# Patient Record
Sex: Female | Born: 1995 | Race: Black or African American | Hispanic: No | Marital: Single
Health system: Midwestern US, Community
[De-identification: ages and names within clinical notes are randomized; demographics above are authoritative.]

## PROBLEM LIST (undated history)

## (undated) DIAGNOSIS — F209 Schizophrenia, unspecified: Secondary | ICD-10-CM

## (undated) DIAGNOSIS — F319 Bipolar disorder, unspecified: Secondary | ICD-10-CM

## (undated) HISTORY — PX: APPENDECTOMY: SHX54

---

## 2018-03-15 ENCOUNTER — Encounter: Payer: Self-pay | Admitting: Emergency Medicine

## 2018-03-15 ENCOUNTER — Emergency Department
Admission: EM | Admit: 2018-03-15 | Discharge: 2018-03-15 | Disposition: A | Discharge status: Home / Self Care | Payer: Medicaid Other | Attending: Emergency Medicine | Admitting: Emergency Medicine

## 2018-03-15 DIAGNOSIS — F17210 Nicotine dependence, cigarettes, uncomplicated: Secondary | ICD-10-CM | POA: Insufficient documentation

## 2018-03-15 DIAGNOSIS — F319 Bipolar disorder, unspecified: Secondary | ICD-10-CM

## 2018-03-15 DIAGNOSIS — R300 Dysuria: Secondary | ICD-10-CM | POA: Diagnosis not present

## 2018-03-15 DIAGNOSIS — F4323 Adjustment disorder with mixed anxiety and depressed mood: Secondary | ICD-10-CM | POA: Diagnosis not present

## 2018-03-15 DIAGNOSIS — R45851 Suicidal ideations: Secondary | ICD-10-CM | POA: Insufficient documentation

## 2018-03-15 DIAGNOSIS — F329 Major depressive disorder, single episode, unspecified: Secondary | ICD-10-CM | POA: Insufficient documentation

## 2018-03-15 DIAGNOSIS — F32A Depression, unspecified: Secondary | ICD-10-CM

## 2018-03-15 HISTORY — DX: Bipolar disorder, unspecified: F31.9

## 2018-03-15 HISTORY — DX: Schizophrenia, unspecified: F20.9

## 2018-03-15 LAB — URINALYSIS, ROUTINE W REFLEX MICROSCOPIC
BACTERIA UA: NONE SEEN
Bilirubin Urine: NEGATIVE
Glucose, UA: NEGATIVE mg/dL
Ketones, ur: NEGATIVE mg/dL
NITRITE: NEGATIVE
PROTEIN: 100 mg/dL — AB
SQUAMOUS EPITHELIAL / LPF: NONE SEEN (ref 0–5)
Specific Gravity, Urine: 1.021 (ref 1.005–1.030)
pH: 7 (ref 5.0–8.0)

## 2018-03-15 LAB — COMPREHENSIVE METABOLIC PANEL
ALK PHOS: 65 U/L (ref 38–126)
ALT: 10 U/L (ref 0–44)
ANION GAP: 7 (ref 5–15)
AST: 17 U/L (ref 15–41)
Albumin: 4 g/dL (ref 3.5–5.0)
BILIRUBIN TOTAL: 0.5 mg/dL (ref 0.3–1.2)
BUN: 11 mg/dL (ref 6–20)
CALCIUM: 8.7 mg/dL — AB (ref 8.9–10.3)
CO2: 26 mmol/L (ref 22–32)
Chloride: 107 mmol/L (ref 98–111)
Creatinine, Ser: 0.73 mg/dL (ref 0.44–1.00)
GFR calc Af Amer: >60 mL/min (ref >60)
GLUCOSE: 110 mg/dL — AB (ref 70–99)
Potassium: 3.4 mmol/L — ABNORMAL LOW (ref 3.5–5.1)
Sodium: 140 mmol/L (ref 135–145)
TOTAL PROTEIN: 7.6 g/dL (ref 6.5–8.1)

## 2018-03-15 LAB — ACETAMINOPHEN LEVEL: ACETAMINOPHEN (TYLENOL), SERUM: 12 ug/mL (ref 10–30)

## 2018-03-15 LAB — CBC
HEMATOCRIT: 34.1 % — AB (ref 35.0–47.0)
Hemoglobin: 11.1 g/dL — ABNORMAL LOW (ref 12.0–16.0)
MCH: 23.7 pg — ABNORMAL LOW (ref 26.0–34.0)
MCHC: 32.5 g/dL (ref 32.0–36.0)
MCV: 72.8 fL — ABNORMAL LOW (ref 80.0–100.0)
Platelets: 325 10*3/uL (ref 150–440)
RBC: 4.68 MIL/uL (ref 3.80–5.20)
RDW: 15.8 % — ABNORMAL HIGH (ref 11.5–14.5)
WBC: 8.3 10*3/uL (ref 3.6–11.0)

## 2018-03-15 LAB — SALICYLATE LEVEL

## 2018-03-15 LAB — URINE DRUG SCREEN, QUALITATIVE (ARMC ONLY)
Amphetamines, Ur Screen: NOT DETECTED
Benzodiazepine, Ur Scrn: NOT DETECTED
Cannabinoid 50 Ng, Ur ~~LOC~~: NOT DETECTED
Cocaine Metabolite,Ur ~~LOC~~: NOT DETECTED
MDMA (Ecstasy)Ur Screen: NOT DETECTED
METHADONE SCREEN, URINE: NOT DETECTED
OPIATE, UR SCREEN: NOT DETECTED
PHENCYCLIDINE (PCP) UR S: NOT DETECTED
Tricyclic, Ur Screen: NOT DETECTED

## 2018-03-15 LAB — ETHANOL: Alcohol, Ethyl (B): <10 mg/dL (ref <10)

## 2018-03-15 LAB — POCT PREGNANCY, URINE: Preg Test, Ur: NEGATIVE

## 2018-03-15 MED ORDER — FOSFOMYCIN TROMETHAMINE 3 G PO PACK
3.0000 g | PACK | Freq: Once | ORAL | Status: AC
  Administered 2018-03-15: 3 g via ORAL
  Filled 2018-03-15: qty 3

## 2018-03-15 MED ORDER — IBUPROFEN 600 MG PO TABS
600.0000 mg | ORAL_TABLET | Freq: Once | ORAL | Status: AC
  Administered 2018-03-15: 600 mg via ORAL
  Filled 2018-03-15: qty 1

## 2018-03-15 NOTE — ED Notes (Signed)
Nurse talked with Patient's mom, and gave her pass-code per Patient's request, but she wanted to rest and talk to mom later today.

## 2018-03-15 NOTE — ED Notes (Signed)
Patient transferred to room 22, received report, Patient is calm and cooperative, will continue to monitor, dressed in burgundy scrubs.

## 2018-03-15 NOTE — ED Notes (Addendum)
Patient's results back with UTI, and Doctor ordered fosfomycin.
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
## 2018-03-15 NOTE — ED Notes (Signed)
Patient complaining of stomach cramps due to menstrual cycle, nurse received order for motrin from Dr. Scotty CourtStafford.

## 2018-03-15 NOTE — ED Notes (Signed)
Patient is resting with eyes closed, arouses easily, nurse will continue to monitor.
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
## 2018-03-15 NOTE — ED Notes (Signed)
Pt changed into scrubs and belongings secured. One bebe purse (gold), iphone charger, iphone, black leggings, calvin klein sports bra, yellow tshirt, blue and white windbreaker, black vans (shoes). One pair earrings put in sterile cup and put in bag.

## 2018-03-15 NOTE — ED Notes (Signed)
Patient changed her mind,patient in showe at this time.

## 2018-03-15 NOTE — ED Notes (Signed)
Patient voiced understanding of discharge instructions, no signs of distress, Patient with UTI and too fosfomycin prior to discharge, all belongings given back to Patient.

## 2018-03-15 NOTE — ED Provider Notes (Signed)
-----------------------------------------   3:43 PM on 03/15/2018 -----------------------------------------  Patient has been seen by psychiatry, they believe the patient is safe for discharge home from a psychiatric standpoint.  Patient's medical work-up is been largely nonrevealing.  Patient is describing mild dysuria, we will wait for urinalysis to result prior to discharge.   Minna AntisPaduchowski, Kyrah Schiro, MD 03/15/18 1544

## 2018-03-15 NOTE — Discharge Instructions (Addendum)
You have been seen in the emergency department for a  psychiatric concern. You have been evaluated both medically as well as psychiatrically. Please follow-up with your outpatient resources provided. Return to the emergency department for any worsening symptoms, or any thoughts of hurting yourself or anyone else so that we may attempt to help you. 

## 2018-03-15 NOTE — ED Notes (Signed)
VOL/Consult pending  
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
## 2018-03-15 NOTE — ED Notes (Signed)
Patient refused shower due she had shower before coming.

## 2018-04-01 ENCOUNTER — Encounter: Payer: Self-pay | Admitting: Emergency Medicine

## 2018-04-01 DIAGNOSIS — R531 Weakness: Secondary | ICD-10-CM | POA: Insufficient documentation

## 2018-04-01 DIAGNOSIS — N3001 Acute cystitis with hematuria: Secondary | ICD-10-CM | POA: Diagnosis not present

## 2018-04-01 DIAGNOSIS — F17200 Nicotine dependence, unspecified, uncomplicated: Secondary | ICD-10-CM | POA: Diagnosis not present

## 2018-04-01 DIAGNOSIS — F209 Schizophrenia, unspecified: Secondary | ICD-10-CM | POA: Diagnosis not present

## 2018-04-01 DIAGNOSIS — F319 Bipolar disorder, unspecified: Secondary | ICD-10-CM | POA: Diagnosis not present

## 2018-04-01 DIAGNOSIS — R109 Unspecified abdominal pain: Secondary | ICD-10-CM | POA: Diagnosis present

## 2018-04-01 LAB — BASIC METABOLIC PANEL
ANION GAP: 6 (ref 5–15)
BUN: 12 mg/dL (ref 6–20)
CALCIUM: 8.8 mg/dL — AB (ref 8.9–10.3)
CO2: 29 mmol/L (ref 22–32)
Chloride: 106 mmol/L (ref 98–111)
Creatinine, Ser: 0.63 mg/dL (ref 0.44–1.00)
Glucose, Bld: 93 mg/dL (ref 70–99)
Potassium: 3.8 mmol/L (ref 3.5–5.1)
SODIUM: 141 mmol/L (ref 135–145)

## 2018-04-01 LAB — URINALYSIS, COMPLETE (UACMP) WITH MICROSCOPIC
BILIRUBIN URINE: NEGATIVE
Glucose, UA: NEGATIVE mg/dL
Hgb urine dipstick: NEGATIVE
KETONES UR: NEGATIVE mg/dL
NITRITE: NEGATIVE
PH: 7 (ref 5.0–8.0)
Protein, ur: NEGATIVE mg/dL
Specific Gravity, Urine: 1.019 (ref 1.005–1.030)

## 2018-04-01 LAB — CBC
HCT: 34.9 % — ABNORMAL LOW (ref 35.0–47.0)
HEMOGLOBIN: 11.5 g/dL — AB (ref 12.0–16.0)
MCH: 23.7 pg — ABNORMAL LOW (ref 26.0–34.0)
MCHC: 32.9 g/dL (ref 32.0–36.0)
MCV: 72.2 fL — ABNORMAL LOW (ref 80.0–100.0)
Platelets: 307 10*3/uL (ref 150–440)
RBC: 4.83 MIL/uL (ref 3.80–5.20)
RDW: 15.7 % — ABNORMAL HIGH (ref 11.5–14.5)
WBC: 5.7 10*3/uL (ref 3.6–11.0)

## 2018-04-01 LAB — POCT PREGNANCY, URINE: Preg Test, Ur: NEGATIVE

## 2018-04-01 NOTE — ED Triage Notes (Signed)
Patient brought in by ems. Patient with complaint of feeling weak, generalized abdominal pain and nausea that started this morning.

## 2018-04-01 NOTE — ED Notes (Signed)
Patient to waiting room via wheelchair by EMS for generalized abdominal pain.  Per EMS patient had no nausea.  VS:  HR - 106; BP 138/91; CBG - 110.

## 2018-04-02 ENCOUNTER — Emergency Department: Payer: Medicaid Other

## 2018-04-02 ENCOUNTER — Encounter: Payer: Self-pay | Admitting: Radiology

## 2018-04-02 ENCOUNTER — Emergency Department
Admission: EM | Admit: 2018-04-02 | Discharge: 2018-04-02 | Disposition: A | Discharge status: Home / Self Care | Payer: Medicaid Other | Attending: Emergency Medicine | Admitting: Emergency Medicine

## 2018-04-02 DIAGNOSIS — R1084 Generalized abdominal pain: Secondary | ICD-10-CM

## 2018-04-02 DIAGNOSIS — N3001 Acute cystitis with hematuria: Secondary | ICD-10-CM

## 2018-04-02 DIAGNOSIS — R531 Weakness: Secondary | ICD-10-CM

## 2018-04-02 MED ORDER — METOCLOPRAMIDE HCL 5 MG/ML IJ SOLN
10.0000 mg | Freq: Once | INTRAMUSCULAR | Status: AC
  Administered 2018-04-02: 10 mg via INTRAVENOUS
  Filled 2018-04-02: qty 2

## 2018-04-02 MED ORDER — METOCLOPRAMIDE HCL 10 MG PO TABS: 10.0000 mg | ORAL_TABLET | Freq: Three times a day (TID) | ORAL | 0 refills | Status: DC | PRN

## 2018-04-02 MED ORDER — IOPAMIDOL (ISOVUE-300) INJECTION 61%
100.0000 mL | Freq: Once | INTRAVENOUS | Status: AC | PRN
  Administered 2018-04-02: 100 mL via INTRAVENOUS

## 2018-04-02 MED ORDER — CEPHALEXIN 500 MG PO CAPS
500.0000 mg | ORAL_CAPSULE | Freq: Once | ORAL | Status: AC
  Administered 2018-04-02: 500 mg via ORAL
  Filled 2018-04-02: qty 1

## 2018-04-02 MED ORDER — SODIUM CHLORIDE 0.9 % IV BOLUS
1000.0000 mL | Freq: Once | INTRAVENOUS | Status: AC
  Administered 2018-04-02: 1000 mL via INTRAVENOUS

## 2018-04-02 MED ORDER — CEPHALEXIN 500 MG PO CAPS: 500.0000 mg | ORAL_CAPSULE | Freq: Four times a day (QID) | ORAL | 0 refills | Status: AC

## 2018-04-02 NOTE — ED Notes (Signed)
Pt returned from CT °

## 2018-04-02 NOTE — ED Provider Notes (Signed)
[facility] Emergency Department Provider Note   ____________________________________________   First MD Initiated Contact with Patient 04/02/18 0209     (approximate)  I have reviewed the triage vital signs and the nursing notes.   HISTORY  Chief Complaint Weakness and Abdominal Pain    HPI Debra Mahoney is a 22 y.o. female who comes into the hospital today with some abdominal pain and feeling weak all over.  The patient also had a headache and states that her symptoms all started this morning.  She states that she could not eat anything all day and has had a bottle of water.  The patient denies any cough, runny nose, fevers or chills.  She is been nauseous with no vomiting.  She denies any pain with urination and also has had no sick contacts.  She states that the pain is all over her abdomen and she just did not feel good.  The patient decided to come into the hospital today for further evaluation of her symptoms.   Past Medical History:  Diagnosis Date  . Bipolar 1 disorder (HCC)   . Schizophrenia [facility])     Patient Active Problem List   Diagnosis Date Noted  . Bipolar 1 disorder (HCC) 03/15/2018  . Adjustment disorder with mixed anxiety and depressed mood 03/15/2018    Past Surgical History:  Procedure Laterality Date  . APPENDECTOMY      Prior to Admission medications   Medication Sig Start Date End Date Taking? Authorizing Provider  cephALEXin (KEFLEX) 500 MG capsule Take 1 capsule (500 mg total) by mouth 4 (four) times daily for 10 days. 04/02/18 04/12/18  Rebecka Apley, MD  metoCLOPramide (REGLAN) 10 MG tablet Take 1 tablet (10 mg total) by mouth every 8 (eight) hours as needed. 04/02/18   Rebecka Apley, MD    Allergies Shellfish allergy  No family history on file.  Social History Social History   Tobacco Use  . Smoking status: Current Every Day Smoker  . Smokeless tobacco: Never Used  Substance Use Topics  . Alcohol  use: Not Currently    Frequency: Never  . Drug use: Not Currently    Review of Systems  Constitutional: Unwell feeling Eyes: No visual changes. ENT: No sore throat. Cardiovascular: Denies chest pain. Respiratory: Denies shortness of breath. Gastrointestinal:  abdominal pain, nausea, no vomiting.  No diarrhea.  No constipation. Genitourinary: Negative for dysuria. Musculoskeletal: Negative for back pain. Skin: Negative for rash. Neurological: Headache, weakness   ____________________________________________   PHYSICAL EXAM:  VITAL SIGNS: ED Triage Vitals  Enc Vitals Group     BP 04/01/18 2305 118/61     Pulse Rate 04/01/18 2305 100     Resp 04/01/18 2305 18     Temp 04/01/18 2305 98.7 F (37.1 C)     Temp Source 04/01/18 2305 Oral     SpO2 04/01/18 2305 100 %     Weight --      Height 04/01/18 2306 5\' 5"  (1.651 m)     Head Circumference --      Peak Flow --      Pain Score 04/01/18 2306 7     Pain Loc --      Pain Edu? --      Excl. in GC? --     Constitutional: Alert and oriented. Well appearing and in mild distress. Eyes: Conjunctivae are normal. PERRL. EOMI. Head: Atraumatic. Nose: No congestion/rhinnorhea. Mouth/Throat: Mucous membranes are moist.  Oropharynx non-erythematous. Cardiovascular: Normal  rate, regular rhythm. Grossly normal heart sounds.  Good peripheral circulation. Respiratory: Normal respiratory effort.  No retractions. Lungs CTAB. Gastrointestinal: Soft with some mild tenderness all of her abdomen. No distention.  Positive bowel sounds Musculoskeletal: No lower extremity tenderness nor edema.   Neurologic:  Normal speech and language.  Skin:  Skin is warm, dry and intact.  Psychiatric: Mood and affect are normal.   ____________________________________________   LABS (all labs ordered are listed, but only abnormal results are displayed)  Labs Reviewed  BASIC METABOLIC PANEL - Abnormal; Notable for the following components:      Result  Value   Calcium 8.8 (*)    All other components within normal limits  CBC - Abnormal; Notable for the following components:   Hemoglobin 11.5 (*)    HCT 34.9 (*)    MCV 72.2 (*)    MCH 23.7 (*)    RDW 15.7 (*)    All other components within normal limits  URINALYSIS, COMPLETE (UACMP) WITH MICROSCOPIC - Abnormal; Notable for the following components:   Color, Urine YELLOW (*)    APPearance HAZY (*)    Leukocytes, UA SMALL (*)    Bacteria, UA RARE (*)    All other components within normal limits  POC URINE PREG, ED  POCT PREGNANCY, URINE   ____________________________________________  EKG  ED ECG REPORT I, Rebecka ApleyWebster,  Journie Howson P, the attending physician, personally viewed and interpreted this ECG.   Date: 04/01/2018  EKG Time: 2311  Rate: 100  Rhythm: normal sinus rhythm  Axis: normal  Intervals:none  ST&T Change: none  ____________________________________________  RADIOLOGY  ED MD interpretation:  CT abd and pelvis: Stomach distended with fluid and ingested contents, lobular hypodensities in the prepyloric stomach likely ingested material however underlying mass could have similar appearance.  Official radiology report(s): Ct Abdomen Pelvis W Contrast  Result Date: 04/02/2018 CLINICAL DATA:  Acute abdominal pain. EXAM: CT ABDOMEN AND PELVIS WITH CONTRAST TECHNIQUE: Multidetector CT imaging of the abdomen and pelvis was performed using the standard protocol following bolus administration of intravenous contrast. CONTRAST:  100mL ISOVUE-300 IOPAMIDOL (ISOVUE-300) INJECTION 61% COMPARISON:  None. FINDINGS: Lower chest: The lung bases are clear. Hepatobiliary: Tiny subcentimeter hepatic hypodensity in the right lobe too small to accurately characterize. Gallbladder physiologically distended, no calcified stone. No biliary dilatation. Pancreas: No ductal dilatation or inflammation. Spleen: Normal in size without focal abnormality. Adrenals/Urinary Tract: Normal adrenal glands. No  hydronephrosis or perinephric edema. Homogeneous renal enhancement. Urinary bladder is partially distended without wall thickening. Stomach/Bowel: Lack of enteric contrast limits bowel assessment. Stomach is distended with fluid and ingested contents. Lobular densities in the dependent stomach just proximal to the pylorus, for example image 32 series 2. Duodenum and remaining small bowel normal in caliber without obstruction or inflammation. Appendix not visualized, surgically absent per history. Moderate stool burden throughout the colon without colonic wall thickening or inflammatory change. Vascular/Lymphatic: No significant vascular findings are present. No enlarged abdominal or pelvic lymph nodes. Reproductive: Uterus is unremarkable. Adnexa not well assessed due to adjacent bowel, no evidence of adnexal mass. Small amount of free fluid in the pelvis likely physiologic. Other: No upper abdominal ascites or free air. No intra-abdominal abscess. Musculoskeletal: There are no acute or suspicious osseous abnormalities. IMPRESSION: Stomach distended with fluid and ingested contents. Lobular hyperdensities in the pre pyloric stomach likely ingested material, however underlying gastric mass could have a similar appearance. Consider endoscopic evaluation. Electronically Signed   By: Rubye OaksMelanie  Ehinger M.D.   On:  04/02/2018 04:41    ____________________________________________   PROCEDURES  Procedure(s) performed: None  Procedures  Critical Care performed: No  ____________________________________________   INITIAL IMPRESSION / ASSESSMENT AND PLAN / ED COURSE  As part of my medical decision making, I reviewed the following data within the electronic MEDICAL RECORD NUMBER Notes from prior ED visits and [place] Controlled Substance Database   This is a 22 year old female who comes into the hospital today with some headache, abdominal pain and unwell feeling.  We did test blood work on the patient to include a  CBC, BMP, urinalysis.  I also checked a CT scan of the patient's abdomen and pelvis.  The patient's blood work is unremarkable aside from her white blood cell count in her urine which is 21-50.  The patient has some rare bacteria and some squamous cells but that would explain the patient's symptoms.  I did see the CT and she does have some distended stomach with undigested contents so I did give her some Reglan and a liter of normal saline.  The patient was sleeping for the entire time that she was in the emergency department.  Given her urine I will treat her with a dose of Keflex.  She will be discharged home and encouraged to follow-up with the acute care clinic.      ____________________________________________   FINAL CLINICAL IMPRESSION(S) / ED DIAGNOSES  Final diagnoses:  Generalized weakness  Generalized abdominal pain  Acute cystitis with hematuria     ED Discharge Orders        Ordered    cephALEXin (KEFLEX) 500 MG capsule  4 times daily     04/02/18 0638    metoCLOPramide (REGLAN) 10 MG tablet  Every 8 hours PRN     04/02/18 1610       Note:  This document was prepared using Dragon voice recognition software and may include unintentional dictation errors.    Rebecka Apley, MD 04/02/18 [phone]

## 2018-04-02 NOTE — ED Notes (Signed)
Pt to CT

## 2018-04-02 NOTE — ED Notes (Signed)
Patient c/o N/V, generalized abdominal pain and dizziness beginning yesterday. Patient reports 1 emesis in the last 24 hours. Patient denies diarrhea, reports constipation; last BM Friday. Patient not tender to palpation of abdomen.

## 2018-05-09 ENCOUNTER — Encounter: Payer: Self-pay | Admitting: Emergency Medicine

## 2018-05-09 ENCOUNTER — Emergency Department
Admission: EM | Admit: 2018-05-09 | Discharge: 2018-05-09 | Disposition: A | Discharge status: Home / Self Care | Payer: Medicaid Other | Attending: Emergency Medicine | Admitting: Emergency Medicine

## 2018-05-09 DIAGNOSIS — Z79899 Other long term (current) drug therapy: Secondary | ICD-10-CM | POA: Insufficient documentation

## 2018-05-09 DIAGNOSIS — M436 Torticollis: Secondary | ICD-10-CM | POA: Diagnosis not present

## 2018-05-09 DIAGNOSIS — F17210 Nicotine dependence, cigarettes, uncomplicated: Secondary | ICD-10-CM | POA: Diagnosis not present

## 2018-05-09 DIAGNOSIS — R253 Fasciculation: Secondary | ICD-10-CM | POA: Diagnosis present

## 2018-05-09 LAB — URINE DRUG SCREEN, QUALITATIVE (ARMC ONLY)
Amphetamines, Ur Screen: NOT DETECTED
Barbiturates, Ur Screen: NOT DETECTED
COCAINE METABOLITE, UR ~~LOC~~: NOT DETECTED
Cannabinoid 50 Ng, Ur ~~LOC~~: NOT DETECTED
MDMA (Ecstasy)Ur Screen: NOT DETECTED
METHADONE SCREEN, URINE: NOT DETECTED
OPIATE, UR SCREEN: NOT DETECTED
Phencyclidine (PCP) Ur S: NOT DETECTED
Tricyclic, Ur Screen: NOT DETECTED

## 2018-05-09 LAB — POCT PREGNANCY, URINE: Preg Test, Ur: NEGATIVE

## 2018-05-09 LAB — COMPREHENSIVE METABOLIC PANEL
ALK PHOS: 66 U/L (ref 38–126)
ALT: 13 U/L (ref 0–44)
AST: 18 U/L (ref 15–41)
Albumin: 4 g/dL (ref 3.5–5.0)
Anion gap: 5 (ref 5–15)
BUN: 11 mg/dL (ref 6–20)
CHLORIDE: 109 mmol/L (ref 98–111)
CO2: 27 mmol/L (ref 22–32)
CREATININE: 0.67 mg/dL (ref 0.44–1.00)
Calcium: 9.1 mg/dL (ref 8.9–10.3)
GFR calc Af Amer: >60 mL/min (ref >60)
GFR calc non Af Amer: >60 mL/min (ref >60)
GLUCOSE: 106 mg/dL — AB (ref 70–99)
Potassium: 3.8 mmol/L (ref 3.5–5.1)
SODIUM: 141 mmol/L (ref 135–145)
Total Bilirubin: 0.3 mg/dL (ref 0.3–1.2)
Total Protein: 7.4 g/dL (ref 6.5–8.1)

## 2018-05-09 LAB — CBC
HEMATOCRIT: 32.4 % — AB (ref 35.0–47.0)
HEMOGLOBIN: 10.8 g/dL — AB (ref 12.0–16.0)
MCH: 24.1 pg — ABNORMAL LOW (ref 26.0–34.0)
MCHC: 33.3 g/dL (ref 32.0–36.0)
MCV: 72.3 fL — ABNORMAL LOW (ref 80.0–100.0)
Platelets: 319 10*3/uL (ref 150–440)
RBC: 4.47 MIL/uL (ref 3.80–5.20)
RDW: 15.9 % — AB (ref 11.5–14.5)
WBC: 6.4 10*3/uL (ref 3.6–11.0)

## 2018-05-09 LAB — ETHANOL

## 2018-05-09 MED ORDER — BENZTROPINE MESYLATE 1 MG PO TABS: 1.0000 mg | ORAL_TABLET | Freq: Two times a day (BID) | ORAL | 0 refills | Status: DC

## 2018-05-09 NOTE — ED Triage Notes (Signed)
Pt ems from home for jaw tremors/drooling. Pt started on haldol 7 days ago. Pt with hx schizophrenia and bipolar.

## 2018-05-09 NOTE — ED Provider Notes (Signed)
[facility] Emergency Department Provider Note  ____________________________________________   First MD Initiated Contact with Patient 05/09/18 1318     (approximate)  I have reviewed the triage vital signs and the nursing notes.   HISTORY  Chief Complaint Medication Reaction   HPI Debra Mahoney is a 22 y.o. female self presents to the emergency department with twisting of her neck and jaw "twitching" that began this morning.  Symptoms came on suddenly are intermittent.  They were improved by taking Benadryl at home.  She feels like her symptoms began when she initiated haloperidol about 1 week ago.  She has a long-standing history of schizoaffective disorder  and previously had adverse reactions to quetiapine.  She is scheduled to see her psychiatrist tomorrow however today her symptoms became distressing and prompted the visit.   Past Medical History:  Diagnosis Date  . Bipolar 1 disorder (HCC)   . Schizophrenia [facility])     Patient Active Problem List   Diagnosis Date Noted  . Bipolar 1 disorder (HCC) 03/15/2018  . Adjustment disorder with mixed anxiety and depressed mood 03/15/2018    Past Surgical History:  Procedure Laterality Date  . APPENDECTOMY      Prior to Admission medications   Medication Sig Start Date End Date Taking? Authorizing Provider  benztropine (COGENTIN) 1 MG tablet Take 1 tablet (1 mg total) by mouth 2 (two) times daily. 05/09/18 05/09/19  Merrily Brittle, MD  metoCLOPramide (REGLAN) 10 MG tablet Take 1 tablet (10 mg total) by mouth every 8 (eight) hours as needed. 04/02/18   Rebecka Apley, MD    Allergies Shellfish allergy  No family history on file.  Social History Social History   Tobacco Use  . Smoking status: Current Every Day Smoker  . Smokeless tobacco: Never Used  Substance Use Topics  . Alcohol use: Not Currently    Frequency: Never  . Drug use: Not Currently    Review of Systems Constitutional: No  fever/chills ENT: No sore throat. Cardiovascular: Denies chest pain. Respiratory: Denies shortness of breath. Gastrointestinal: No abdominal pain.  No nausea, no vomiting.  Musculoskeletal: Positive for neck spasm Neurological: Negative for headaches   ____________________________________________   PHYSICAL EXAM:  VITAL SIGNS: ED Triage Vitals [05/09/18 1253]  Enc Vitals Group     BP 120/69     Pulse Rate 79     Resp 18     Temp 98.2 F (36.8 C)     Temp Source Oral     SpO2 100 %     Weight 190 lb (86.2 kg)     Height 5\' 5"  (1.651 m)     Head Circumference      Peak Flow      Pain Score 10     Pain Loc      Pain Edu?      Excl. in GC?     Constitutional: Alert and oriented x4 appropriate cooperative appears somewhat uncomfortable Head: Atraumatic. Nose: No congestion/rhinnorhea. Mouth/Throat: No trismus Neck: No stridor.   Cardiovascular: Regular rate and rhythm Respiratory: Normal respiratory effort.  No retractions. Gastrointestinal: Soft nontender Neurologic:  Normal speech and language.  Some EPS with torticollis Skin:  Skin is warm, dry and intact. No rash noted.    ____________________________________________  LABS (all labs ordered are listed, but only abnormal results are displayed)  Labs Reviewed  COMPREHENSIVE METABOLIC PANEL - Abnormal; Notable for the following components:      Result Value   Glucose, Bld  106 (*)    All other components within normal limits  CBC - Abnormal; Notable for the following components:   Hemoglobin 10.8 (*)    HCT 32.4 (*)    MCV 72.3 (*)    MCH 24.1 (*)    RDW 15.9 (*)    All other components within normal limits  URINE DRUG SCREEN, QUALITATIVE ([facility] ONLY) - Abnormal; Notable for the following components:   Benzodiazepine, Ur Scrn TEST NOT PERFORMED, REAGENT NOT AVAILABLE (*)    All other components within normal limits  ETHANOL  POCT PREGNANCY, URINE    Lab work reviewed by me with no acute  disease __________________________________________  EKG   ____________________________________________  RADIOLOGY   ____________________________________________   DIFFERENTIAL includes but not limited to  EPS, torticollis, medication reaction   PROCEDURES  Procedure(s) performed: no  Procedures  Critical Care performed: no  ____________________________________________   INITIAL IMPRESSION / ASSESSMENT AND PLAN / ED COURSE  Pertinent labs & imaging results that were available during my care of the patient were reviewed by me and considered in my medical decision making (see chart for details).   As part of my medical decision making, I reviewed the following data within the electronic MEDICAL RECORD NUMBER History obtained from family if available, nursing notes, old chart and ekg, as well as notes from prior ED visits.  Patient symptoms are most consistent with torticollis and EPS.  Given Cogentin with improvement in her symptoms.  Of advised her to stop taking haloperidol until she sees her psychiatrist tomorrow.  Strict return precautions have been given.      ____________________________________________   FINAL CLINICAL IMPRESSION(S) / ED DIAGNOSES  Final diagnoses:  Torticollis      NEW MEDICATIONS STARTED DURING THIS VISIT:  Discharge Medication List as of 05/09/2018  1:31 PM    START taking these medications   Details  benztropine (COGENTIN) 1 MG tablet Take 1 tablet (1 mg total) by mouth 2 (two) times daily., Starting Tue 05/09/2018, Until Wed 05/09/2019, Print         Note:  This document was prepared using Dragon voice recognition software and may include unintentional dictation errors.      Merrily Brittle, MD 05/11/18 [phone]

## 2018-05-09 NOTE — ED Triage Notes (Signed)
Patient presents to the ED with jaw "twitching" that began this morning.  Patient was started on Haldol approx 1 week ago and states she believes that is the cause.  Patient is in no obvious distress at this time and states, "it is not doing it right now, but it probably will again."

## 2018-05-09 NOTE — Discharge Instructions (Signed)
These begin taking Cogentin twice a day as prescribed and follow-up with your psychiatrist tomorrow as scheduled.  If you are ever in a situation where you are having the side effects and do not have Cogentin please take 50 mg of over-the-counter Benadryl.  Return to the emergency department for any concerns whatsoever.  I recommend you do not take haloperidol tonight or tomorrow morning until you can follow-up with your psychiatrist.  It was a pleasure to take care of you today, and thank you for coming to our emergency department.  If you have any questions or concerns before leaving please ask the nurse to grab me and I'm more than happy to go through your aftercare instructions again.  If you were prescribed any opioid pain medication today such as Norco, Vicodin, Percocet, morphine, hydrocodone, or oxycodone please make sure you do not drive when you are taking this medication as it can alter your ability to drive safely.  If you have any concerns once you are home that you are not improving or are in fact getting worse before you can make it to your follow-up appointment, please do not hesitate to call 911 and come back for further evaluation.  Merrily Brittle, MD  Results for orders placed or performed during the hospital encounter of 05/09/18  cbc  Result Value Ref Range   WBC 6.4 3.6 - 11.0 K/uL   RBC 4.47 3.80 - 5.20 MIL/uL   Hemoglobin 10.8 (L) 12.0 - 16.0 g/dL   HCT 86.7 (L) 67.2 - 09.4 %   MCV 72.3 (L) 80.0 - 100.0 fL   MCH 24.1 (L) 26.0 - 34.0 pg   MCHC 33.3 32.0 - 36.0 g/dL   RDW 70.9 (H) 62.8 - 36.6 %   Platelets 319 150 - 440 K/uL  Pregnancy, urine POC  Result Value Ref Range   Preg Test, Ur NEGATIVE NEGATIVE

## 2018-05-30 ENCOUNTER — Other Ambulatory Visit: Payer: Self-pay

## 2018-05-30 ENCOUNTER — Encounter: Payer: Self-pay | Admitting: Emergency Medicine

## 2018-05-30 ENCOUNTER — Emergency Department
Admission: EM | Admit: 2018-05-30 | Discharge: 2018-05-30 | Disposition: A | Discharge status: Home / Self Care | Payer: Medicaid Other | Attending: Emergency Medicine | Admitting: Emergency Medicine

## 2018-05-30 DIAGNOSIS — F17200 Nicotine dependence, unspecified, uncomplicated: Secondary | ICD-10-CM | POA: Insufficient documentation

## 2018-05-30 DIAGNOSIS — R51 Headache: Secondary | ICD-10-CM | POA: Insufficient documentation

## 2018-05-30 DIAGNOSIS — Z79899 Other long term (current) drug therapy: Secondary | ICD-10-CM | POA: Insufficient documentation

## 2018-05-30 DIAGNOSIS — R519 Headache, unspecified: Secondary | ICD-10-CM

## 2018-05-30 LAB — POCT PREGNANCY, URINE: PREG TEST UR: NEGATIVE

## 2018-05-30 MED ORDER — SODIUM CHLORIDE 0.9 % IV BOLUS
1000.0000 mL | Freq: Once | INTRAVENOUS | Status: AC
  Administered 2018-05-30: 1000 mL via INTRAVENOUS

## 2018-05-30 MED ORDER — PROCHLORPERAZINE EDISYLATE 10 MG/2ML IJ SOLN
10.0000 mg | Freq: Once | INTRAMUSCULAR | Status: AC
  Administered 2018-05-30: 10 mg via INTRAVENOUS
  Filled 2018-05-30: qty 2

## 2018-05-30 NOTE — ED Provider Notes (Signed)
[facility] Emergency Department Provider Note  ____________________________________________   I have reviewed the triage vital signs and the nursing notes.   HISTORY  Chief Complaint Headache   History limited by: Not Limited   HPI Debra Mahoney is a 22 y.o. female who presents to the emergency department today because of concerns for headache.  She states is been going on for roughly 1 week.  It is generalized.  She states it is worse with bright lights.  Patient denies any history of migraines.  Denies any trauma to her head.  She has had some nausea and vomiting.  Denies any fevers.   Per medical record review patient has a history of bipolar, schizophrenia.   Past Medical History:  Diagnosis Date  . Bipolar 1 disorder (HCC)   . Schizophrenia [facility](HCC)     Patient Active Problem List   Diagnosis Date Noted  . Bipolar 1 disorder (HCC) 03/15/2018  . Adjustment disorder with mixed anxiety and depressed mood 03/15/2018    Past Surgical History:  Procedure Laterality Date  . APPENDECTOMY      Prior to Admission medications   Medication Sig Start Date End Date Taking? Authorizing Provider  benztropine (COGENTIN) 1 MG tablet Take 1 tablet (1 mg total) by mouth 2 (two) times daily. 05/09/18 05/09/19  Merrily Brittleifenbark, Neil, MD  metoCLOPramide (REGLAN) 10 MG tablet Take 1 tablet (10 mg total) by mouth every 8 (eight) hours as needed. 04/02/18   Rebecka ApleyWebster, Allison P, MD    Allergies Shellfish allergy  No family history on file.  Social History Social History   Tobacco Use  . Smoking status: Current Every Day Smoker  . Smokeless tobacco: Never Used  Substance Use Topics  . Alcohol use: Not Currently    Frequency: Never  . Drug use: Not Currently    Review of Systems Constitutional: No fever/chills Eyes: No visual changes. ENT: No sore throat. Cardiovascular: Denies chest pain. Respiratory: Denies shortness of breath. Gastrointestinal: Positive for  nausea and vomiting.  Genitourinary: Negative for dysuria. Musculoskeletal: Negative for back pain. Skin: Negative for rash. Neurological: Positive for headache.  ____________________________________________   PHYSICAL EXAM:  VITAL SIGNS: ED Triage Vitals [05/30/18 1932]  Enc Vitals Group     BP 122/65     Pulse Rate 95     Resp 18     Temp 98.9 F (37.2 C)     Temp Source Oral     SpO2 100 %     Weight 200 lb (90.7 kg)     Height 5\' 6"  (1.676 m)     Head Circumference      Peak Flow      Pain Score 8   Constitutional: Alert and oriented.  Eyes: Conjunctivae are normal.  ENT      Head: Normocephalic and atraumatic.      Nose: No congestion/rhinnorhea.      Mouth/Throat: Mucous membranes are moist.      Neck: No stridor. Hematological/Lymphatic/Immunilogical: No cervical lymphadenopathy. Cardiovascular: Normal rate, regular rhythm.  No murmurs, rubs, or gallops.  Respiratory: Normal respiratory effort without tachypnea nor retractions. Breath sounds are clear and equal bilaterally. No wheezes/rales/rhonchi. Gastrointestinal: Soft and non tender. No rebound. No guarding.  Genitourinary: Deferred Musculoskeletal: Normal range of motion in all extremities. No lower extremity edema. Neurologic:  Normal speech and language. No gross focal neurologic deficits are appreciated.  Skin:  Skin is warm, dry and intact. No rash noted. Psychiatric: Mood and affect are normal. Speech and  behavior are normal. Patient exhibits appropriate insight and judgment.  ____________________________________________    LABS (pertinent positives/negatives)  Upreg negative  ____________________________________________   EKG  None  ____________________________________________    RADIOLOGY  None  ____________________________________________   PROCEDURES  Procedures  ____________________________________________   INITIAL IMPRESSION / ASSESSMENT AND PLAN / ED  COURSE  Pertinent labs & imaging results that were available during my care of the patient were reviewed by me and considered in my medical decision making (see chart for details).   Patient presented to the emergency department today with primary concern for headache.  Patient's physical exam without concerning findings.  At this point I think tension headache or migraine most likely.  Patient was given IV fluids and medications and did feel better.  She felt comfortable going home.  At this point given lack of neurologic findings doubt meningitis.  Doubt intracranial bleed or mass.   ____________________________________________   FINAL CLINICAL IMPRESSION(S) / ED DIAGNOSES  Final diagnoses:  Bad headache     Note: This dictation was prepared with Dragon dictation. Any transcriptional errors that result from this process are unintentional     Phineas Semen, MD 05/30/18 2222

## 2018-05-30 NOTE — Discharge Instructions (Addendum)
Please seek medical attention for any high fevers, chest pain, shortness of breath, change in behavior, persistent vomiting, bloody stool or any other new or concerning symptoms.  

## 2018-05-30 NOTE — ED Triage Notes (Signed)
Patient ambulatory to triage with steady gait, without difficulty or distress noted; pt st last wk "lights and sounds are bothering", generalized HA; denies hx of same

## 2018-09-26 LAB — HM HIV SCREENING LAB: HM HIV Screening: NEGATIVE

## 2019-05-06 ENCOUNTER — Emergency Department
Admission: EM | Admit: 2019-05-06 | Discharge: 2019-05-06 | Disposition: A | Discharge status: Home / Self Care | Payer: Medicaid Other | Attending: Emergency Medicine | Admitting: Emergency Medicine

## 2019-05-06 ENCOUNTER — Encounter: Payer: Self-pay | Admitting: Emergency Medicine

## 2019-05-06 ENCOUNTER — Other Ambulatory Visit: Payer: Self-pay

## 2019-05-06 DIAGNOSIS — F17210 Nicotine dependence, cigarettes, uncomplicated: Secondary | ICD-10-CM | POA: Insufficient documentation

## 2019-05-06 DIAGNOSIS — F319 Bipolar disorder, unspecified: Secondary | ICD-10-CM | POA: Insufficient documentation

## 2019-05-06 DIAGNOSIS — F259 Schizoaffective disorder, unspecified: Secondary | ICD-10-CM | POA: Diagnosis not present

## 2019-05-06 DIAGNOSIS — R45851 Suicidal ideations: Secondary | ICD-10-CM | POA: Diagnosis not present

## 2019-05-06 DIAGNOSIS — F331 Major depressive disorder, recurrent, moderate: Secondary | ICD-10-CM | POA: Insufficient documentation

## 2019-05-06 LAB — COMPREHENSIVE METABOLIC PANEL
ALT: 16 U/L (ref 0–44)
AST: 18 U/L (ref 15–41)
Albumin: 4.4 g/dL (ref 3.5–5.0)
Alkaline Phosphatase: 50 U/L (ref 38–126)
Anion gap: 9 (ref 5–15)
BUN: 12 mg/dL (ref 6–20)
CO2: 25 mmol/L (ref 22–32)
Calcium: 9.5 mg/dL (ref 8.9–10.3)
Chloride: 105 mmol/L (ref 98–111)
Creatinine, Ser: 0.62 mg/dL (ref 0.44–1.00)
GFR calc Af Amer: >60 mL/min (ref >60)
GFR calc non Af Amer: >60 mL/min (ref >60)
Glucose, Bld: 99 mg/dL (ref 70–99)
Potassium: 3.7 mmol/L (ref 3.5–5.1)
Sodium: 139 mmol/L (ref 135–145)
Total Bilirubin: 0.2 mg/dL — ABNORMAL LOW (ref 0.3–1.2)
Total Protein: 8.3 g/dL — ABNORMAL HIGH (ref 6.5–8.1)

## 2019-05-06 LAB — CBC
HCT: 40.8 % (ref 36.0–46.0)
Hemoglobin: 12.9 g/dL (ref 12.0–15.0)
MCH: 23.8 pg — ABNORMAL LOW (ref 26.0–34.0)
MCHC: 31.6 g/dL (ref 30.0–36.0)
MCV: 75.1 fL — ABNORMAL LOW (ref 80.0–100.0)
Platelets: 363 10*3/uL (ref 150–400)
RBC: 5.43 MIL/uL — ABNORMAL HIGH (ref 3.87–5.11)
RDW: 16.3 % — ABNORMAL HIGH (ref 11.5–15.5)
WBC: 7.4 10*3/uL (ref 4.0–10.5)
nRBC: 0 % (ref 0.0–0.2)

## 2019-05-06 LAB — URINE DRUG SCREEN, QUALITATIVE (ARMC ONLY)
Amphetamines, Ur Screen: NOT DETECTED
Barbiturates, Ur Screen: NOT DETECTED
Benzodiazepine, Ur Scrn: NOT DETECTED
Cannabinoid 50 Ng, Ur ~~LOC~~: POSITIVE — AB
Cocaine Metabolite,Ur ~~LOC~~: NOT DETECTED
MDMA (Ecstasy)Ur Screen: NOT DETECTED
Methadone Scn, Ur: NOT DETECTED
Opiate, Ur Screen: NOT DETECTED
Phencyclidine (PCP) Ur S: NOT DETECTED
Tricyclic, Ur Screen: NOT DETECTED

## 2019-05-06 LAB — ACETAMINOPHEN LEVEL: Acetaminophen (Tylenol), Serum: <10 ug/mL — ABNORMAL LOW (ref 10–30)

## 2019-05-06 LAB — SALICYLATE LEVEL: Salicylate Lvl: <7 mg/dL (ref 2.8–30.0)

## 2019-05-06 LAB — HCG, QUANTITATIVE, PREGNANCY: hCG, Beta Chain, Quant, S: <1 m[IU]/mL

## 2019-05-06 LAB — ABO/RH: ABO/RH(D): O POS

## 2019-05-06 LAB — ETHANOL: Alcohol, Ethyl (B): <10 mg/dL (ref <10)

## 2019-05-06 LAB — POCT PREGNANCY, URINE: Preg Test, Ur: NEGATIVE

## 2019-05-06 MED ORDER — DIPHENHYDRAMINE HCL 25 MG PO CAPS
25.0000 mg | ORAL_CAPSULE | Freq: Once | ORAL | Status: DC
  Filled 2019-05-06: qty 1

## 2019-05-06 NOTE — ED Provider Notes (Addendum)
[facility] Emergency Department Provider Note  ____________________________________________   I have reviewed the triage vital signs and the nursing notes. Where available I have reviewed prior notes and, if possible and indicated, outside hospital notes.   Patient seen and evaluated during the coronavirus epidemic during a time with low staffing  Patient seen for the symptoms described in the history of present illness. She was evaluated in the context of the global COVID-19 pandemic, which necessitated consideration that the patient might be at risk for infection with the SARS-CoV-2 virus that causes COVID-19. Institutional protocols and algorithms that pertain to the evaluation of patients at risk for COVID-19 are in a state of rapid change based on information released by regulatory bodies including the CDC and federal and state organizations. These policies and algorithms were followed during the patient's care in the ED.    HISTORY  Chief Complaint Suicidal    HPI Debra Mahoney is a 23 y.o. female  With a history of schizoaffective disorder, who has had prior suicide attempts, she states she is pregnant last menstrual period was early July, she is having disputes about whether to keep the baby with her boyfriend, she states she is having depression and suicidal thoughts.  She has an unstable living arrangement and unable to take some of her medications because of her pregnancy.  She does not have a specific plan about killing her cell but she has been tearful and suicidal she states.  She does state that she has had some minor spotting but no abdominal pain she does not want a pelvic exam but she does consent to a ultrasound.      Past Medical History:  Diagnosis Date  . Bipolar 1 disorder (HCC)   . Schizophrenia [facility])     Patient Active Problem List   Diagnosis Date Noted  . Bipolar 1 disorder (HCC) 03/15/2018  . Adjustment disorder with mixed anxiety  and depressed mood 03/15/2018    Past Surgical History:  Procedure Laterality Date  . APPENDECTOMY      Prior to Admission medications   Not on File    Allergies Shellfish allergy  No family history on file.  Social History Social History   Tobacco Use  . Smoking status: Current Every Day Smoker  . Smokeless tobacco: Never Used  Substance Use Topics  . Alcohol use: Not Currently    Frequency: Never  . Drug use: Not Currently    Review of Systems Constitutional: No fever/chills Eyes: No visual changes. ENT: No sore throat. No stiff neck no neck pain Cardiovascular: Denies chest pain. Respiratory: Denies shortness of breath. Gastrointestinal:   no vomiting.  No diarrhea.  No constipation. Genitourinary: Negative for dysuria. Musculoskeletal: Negative lower extremity swelling Skin: Negative for rash. Neurological: Negative for severe headaches, focal weakness or numbness.   ____________________________________________   PHYSICAL EXAM:  VITAL SIGNS: ED Triage Vitals  Enc Vitals Group     BP 05/06/19 1716 136/72     Pulse Rate 05/06/19 1716 95     Resp 05/06/19 1716 16     Temp 05/06/19 1716 98.2 F (36.8 C)     Temp Source 05/06/19 1716 Oral     SpO2 05/06/19 1716 98 %     Weight --      Height --      Head Circumference --      Peak Flow --      Pain Score 05/06/19 1717 0     Pain Loc --  Pain Edu? --      Excl. in GC? --     Constitutional: Alert and oriented. Well appearing and in no acute distress. Eyes: Conjunctivae are normal Head: Atraumatic HEENT: No congestion/rhinnorhea. Mucous membranes are moist.  Oropharynx non-erythematous Neck:   Nontender with no meningismus, no masses, no stridor Cardiovascular: Normal rate, regular rhythm. Grossly normal heart sounds.  Good peripheral circulation. Respiratory: Normal respiratory effort.  No retractions. Lungs CTAB. Abdominal: Soft and nontender. No distention. No guarding no rebound Back:   There is no focal tenderness or step off.  there is no midline tenderness there are no lesions noted. there is no CVA tenderness Musculoskeletal: No lower extremity tenderness, no upper extremity tenderness. No joint effusions, no DVT signs strong distal pulses no edema Neurologic:  Normal speech and language. No gross focal neurologic deficits are appreciated.  Skin:  Skin is warm, dry and intact. No rash noted. Psychiatric: Mood and affect are tearful and upset  ____________________________________________   LABS (all labs ordered are listed, but only abnormal results are displayed)  Labs Reviewed  COMPREHENSIVE METABOLIC PANEL - Abnormal; Notable for the following components:      Result Value   Total Protein 8.3 (*)    Total Bilirubin 0.2 (*)    All other components within normal limits  ACETAMINOPHEN LEVEL - Abnormal; Notable for the following components:   Acetaminophen (Tylenol), Serum <10 (*)    All other components within normal limits  CBC - Abnormal; Notable for the following components:   RBC 5.43 (*)    MCV 75.1 (*)    MCH 23.8 (*)    RDW 16.3 (*)    All other components within normal limits  ETHANOL  SALICYLATE LEVEL  URINE DRUG SCREEN, QUALITATIVE ([facility] ONLY)  HCG, QUANTITATIVE, PREGNANCY  POC URINE PREG, ED  ABO/RH    Pertinent labs  results that were available during my care of the patient were reviewed by me and considered in my medical decision making (see chart for details). ____________________________________________  EKG  I personally interpreted any EKGs ordered by me or triage  ____________________________________________  RADIOLOGY  Pertinent labs & imaging results that were available during my care of the patient were reviewed by me and considered in my medical decision making (see chart for details). If possible, patient and/or family made aware of any abnormal findings.  No results found. ____________________________________________     PROCEDURES  Procedure(s) performed: None  Procedures  Critical Care performed: None  ____________________________________________   INITIAL IMPRESSION / ASSESSMENT AND PLAN / ED COURSE  Pertinent labs & imaging results that were available during my care of the patient were reviewed by me and considered in my medical decision making (see chart for details).   With schizophrenia, has suicidal thoughts and many life stressors, she is also pregnant and states that she has had some slight spotting, will obtain ultrasound her last menstrual period was early July, we will evaluate her for pregnancy complications including a quant and we will have psychiatry see her for her suicidal thoughts     ----------------------------------------- 7:06 PM on 05/06/2019 -----------------------------------------  Patient is not pregnant.     ____________________________________________   FINAL CLINICAL IMPRESSION(S) / ED DIAGNOSES  Final diagnoses:  None      This chart was dictated using voice recognition software.  Despite best efforts to proofread,  errors can occur which can change meaning.      Jeanmarie PlantMcShane, Yaniah Thiemann A, MD 05/06/19 1817    Ileana RoupMcShane, Murrel Freet  A, MD 05/06/19 4332

## 2019-05-06 NOTE — ED Notes (Signed)
Pt dressed out by this RN and Willia Craze, EDT. Pts belongings were placed in pt belongings bag with a patient and green tag. Pt was given hospital scrubs to wear. Pt had with her the following  1 yellow metal necklace with arrow charm 1 pair of silver earrings with clear stone 1 yellow band bracelet  1 pair of black sandals  1 hot pink tank top 1 pair of black shorts  1 black cell phone  Pt had with her the following medications  Benztropine Mesylate 1 mg tablets  Haloperidol 2 mg and 5 mg tablets  Sertraline 50 mg tablets

## 2019-05-06 NOTE — ED Notes (Signed)
Pt states she is pregnant and stopped taking her medications due to the pregnancy. She has a 22 year old who is in the custody of her mother in [place]. Pt recently released from jail and can't go to [facility] because of her probation. She thought she would be making a life with her boyfriend and new baby but he handed her the phone and had an abortion clinic on the line. He then told her he did not want to continue the relationship. Pt feels hopeless and her plan was to take the medications she had with her, but decided to come to the hospital instead. Pt tearful but receptive to therapeutic conversation with this nurse.

## 2019-05-06 NOTE — Progress Notes (Signed)
Report on Situation, Background, Assessment, and Recommendations received from [facility]. Patient alert and in no distress. Pt. Given all belongings and medications for discharge. Pt. Verbalizes understanding of discharge paperwork. Pt. Denies si/hi. Pt. Escorted off unit.

## 2019-05-06 NOTE — ED Triage Notes (Signed)
Pt to ED via [employer]. Pt is currently voluntary at this time. Pt states that called 911 today because she was having thoughts of harming herself by taking pills. Pt states that she did not actually take the pills. Pt reports that she is currently pregnant, unsure how far along she is. Pt states that her LMP was 03/11/19. Pt stopped taking all of her routine medications when she found out she was pregnant. Pt has hx/o Bi-polar and Schizophrenia. Pt is calm and cooperative in triage at this time, pt is somewhat tearful.

## 2019-05-06 NOTE — ED Notes (Signed)
Pt finished with SOC att reports plan to DC, pt requesting "homelessness resources"

## 2019-05-06 NOTE — ED Notes (Signed)
Pt requested to use phone to call her mother. Pt given phone.

## 2019-05-06 NOTE — ED Notes (Signed)
Report to [facility] Dr Philis Pique complete att, pt to room 22 for [facility] - -room 20 has no door

## 2019-05-06 NOTE — ED Notes (Signed)
Pt's meds reconciled and sent to pharmacy via Delfino Lovett, Pharmacy Tech. Inventory and receipt placed in chart.  Receipt number [id number]

## 2019-05-06 NOTE — ED Notes (Signed)
Pt reports making threats to harm herself earlier with pill ingestion but now reports "I just needed to talk to my baby daddy and my mama and that calmed me down"  -- pt was noted crying on the phone at 1900  Pt appears relaxed and calm

## 2019-05-06 NOTE — Discharge Instructions (Signed)
Return to the Emergency Department for any new or worrisome symptoms including thoughts of hurting yourself or anyone else.  You have contracted for safety, please remember that.  You are not pregnant, therefore you can go back to taking your regular medications.  Please follow-up with your psychiatrist and or the one listed above.  If you have any thoughts of hurting yourself or feel that the world is too much for you please return to the ER as we agreed

## 2019-05-06 NOTE — ED Notes (Signed)
SOC on att

## 2019-07-30 IMAGING — CT CT ABD-PELV W/ CM
2 of 4 series · 16 of 46 positions shown, 18 images · IV contrast (APPLIED)
Comparison: None.

CLINICAL DATA: Acute abdominal pain.

EXAM:
CT ABDOMEN AND PELVIS WITH CONTRAST
TECHNIQUE: Multidetector CT imaging of the abdomen and pelvis was performed
using the standard protocol following bolus administration of
intravenous contrast.
CONTRAST:  100mL [id number] IOPAMIDOL ([id number]) INJECTION 61%

[Series 2: routine abd/pel with · axial · 0.61mm/px · z∈[-980,-555]mm · 13 of 93 slices shown, 15 images]
[im 4/93  soft-tissue]
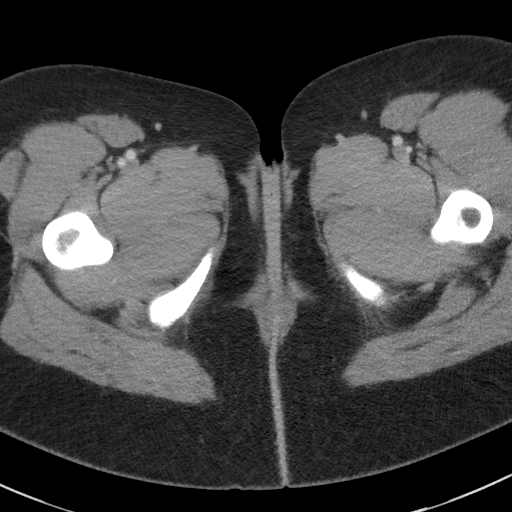
[im 4/93  bone]
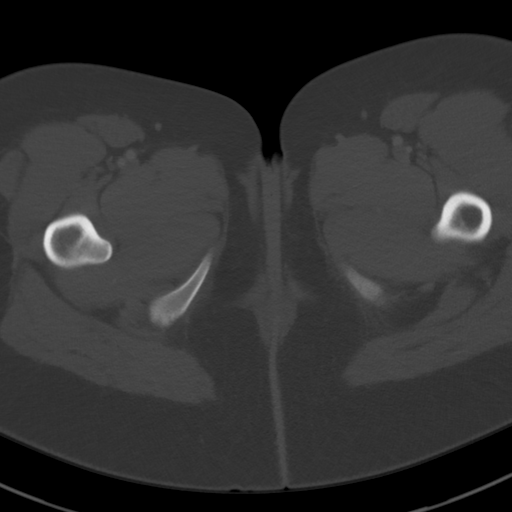
[im 12/93  soft-tissue]
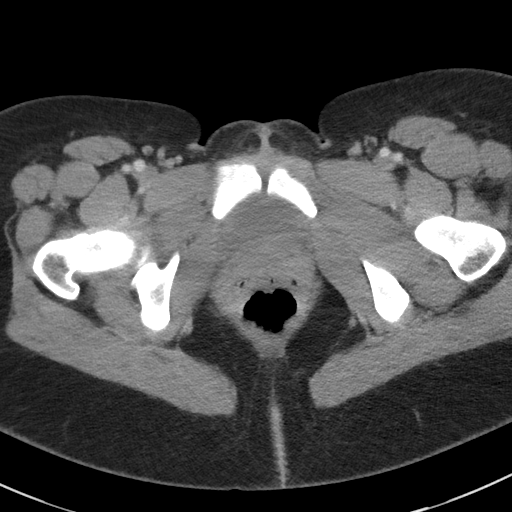
[im 20/93  soft-tissue]
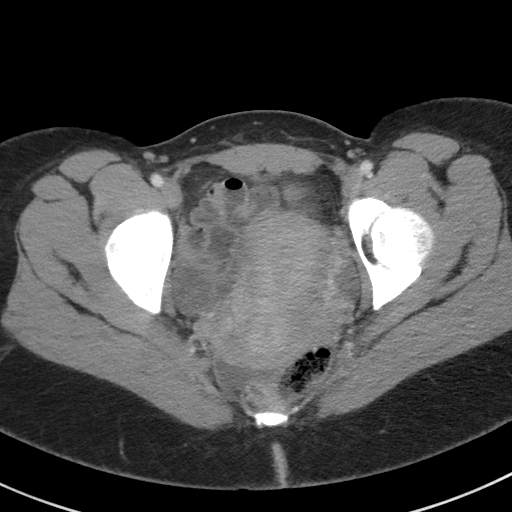
[im 27/93  soft-tissue]
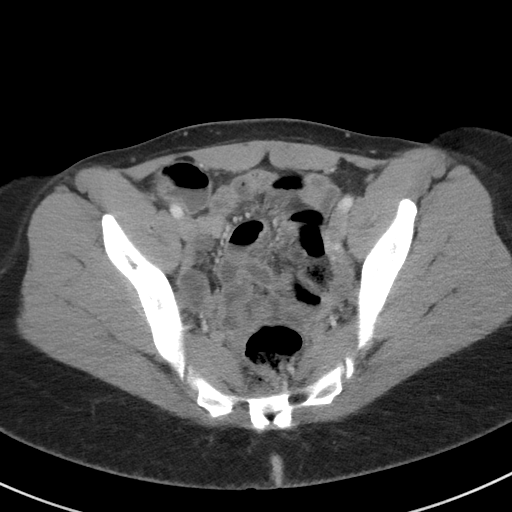
[im 31/93  soft-tissue]
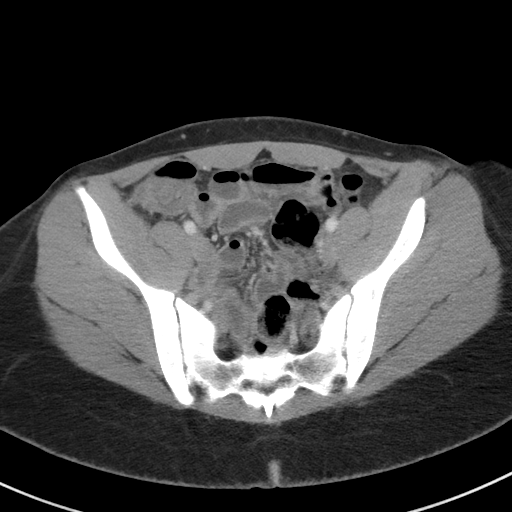
[im 39/93  soft-tissue]
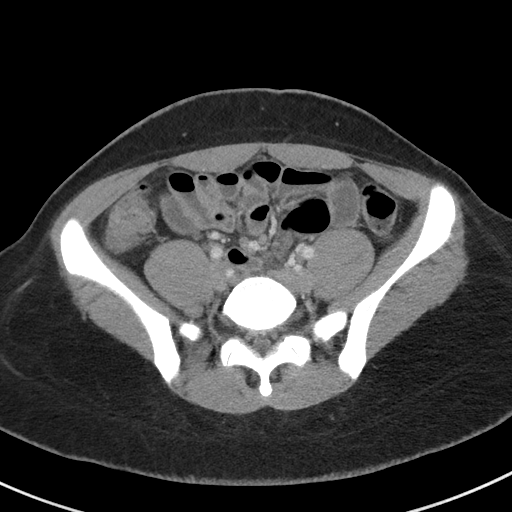
[im 47/93  soft-tissue]
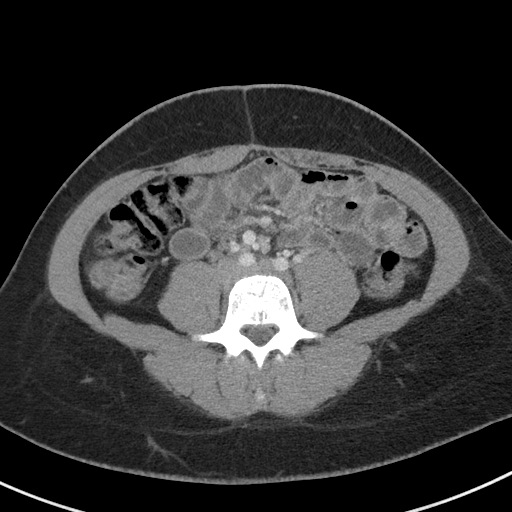
[im 54/93  soft-tissue]
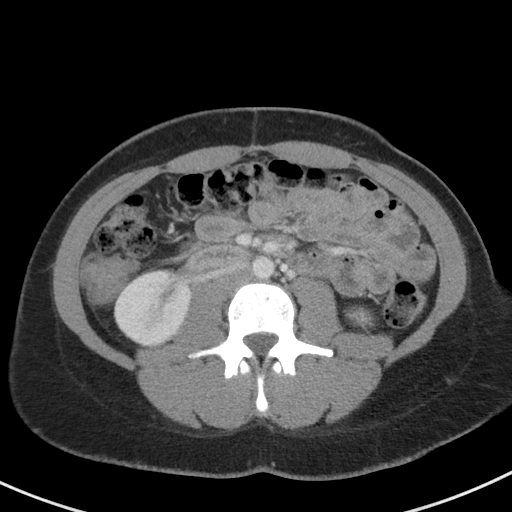
[im 62/93  soft-tissue]
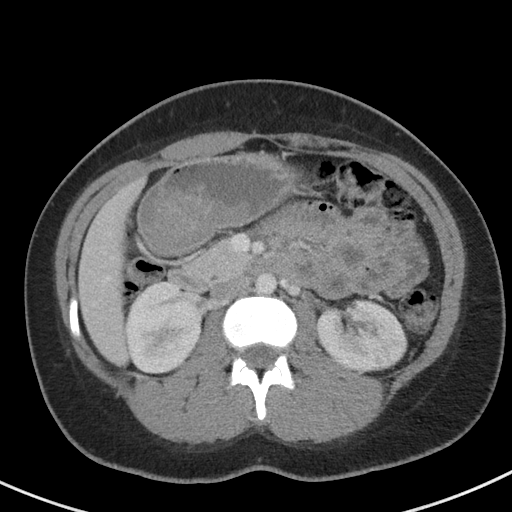
[im 62/93  bone]
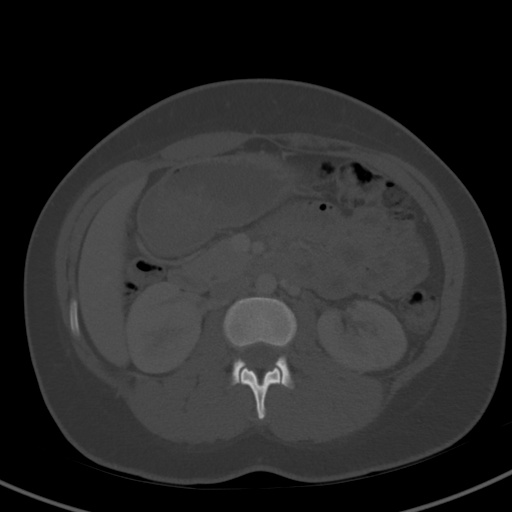
[im 66/93  soft-tissue]
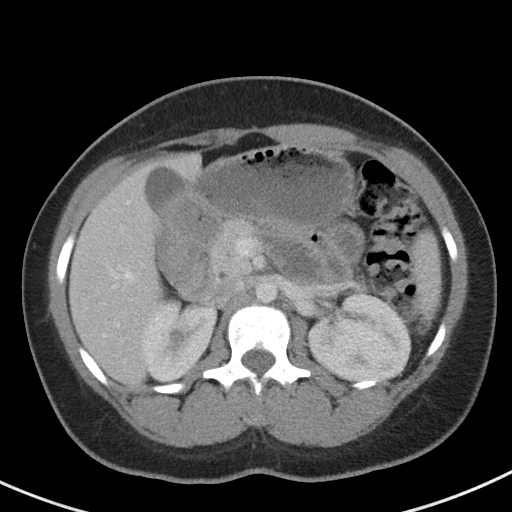
[im 73/93  soft-tissue]
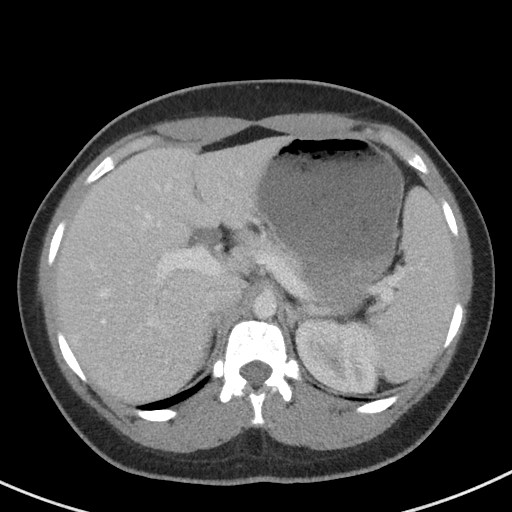
[im 81/93  soft-tissue]
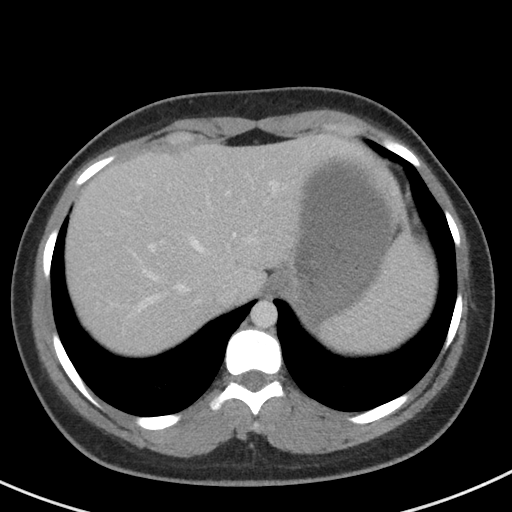
[im 89/93  soft-tissue]
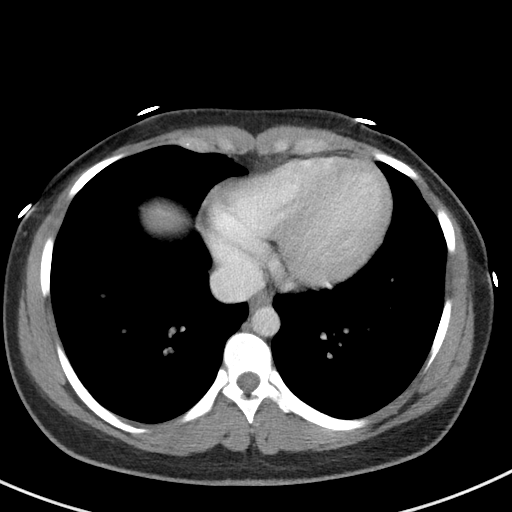

[Series 5: coronal st · coronal · 0.79mm/px · 3 of 77 slices shown]
[im 26/77  soft-tissue]
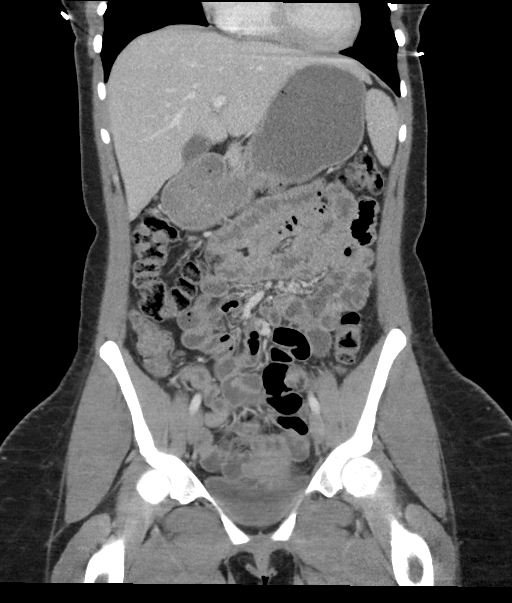
[im 34/77  soft-tissue]
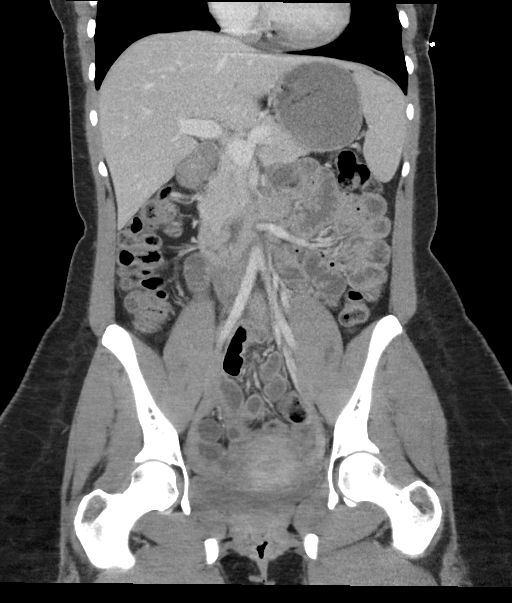
[im 43/77  soft-tissue]
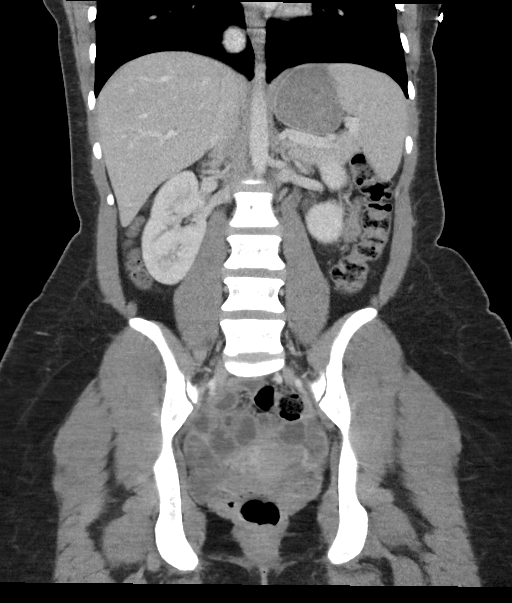

[16 of 46 positions shown; findings below may reference images not displayed]

FINDINGS: Lower chest: The lung bases are clear.

Hepatobiliary: Tiny subcentimeter hepatic hypodensity in the right
lobe too small to accurately characterize. Gallbladder
physiologically distended, no calcified stone. No biliary
dilatation.

Pancreas: No ductal dilatation or inflammation.

Spleen: Normal in size without focal abnormality.

Adrenals/Urinary Tract: Normal adrenal glands. No hydronephrosis or
perinephric edema. Homogeneous renal enhancement. Urinary bladder is
partially distended without wall thickening.

Stomach/Bowel: Lack of enteric contrast limits bowel assessment.
Stomach is distended with fluid and ingested contents. Lobular
densities in the dependent stomach just proximal to the pylorus, for
example image 32 series 2. Duodenum and remaining small bowel normal
in caliber without obstruction or inflammation. Appendix not
visualized, surgically absent per history. Moderate stool burden
throughout the colon without colonic wall thickening or inflammatory
change.

Vascular/Lymphatic: No significant vascular findings are present. No
enlarged abdominal or pelvic lymph nodes.

Reproductive: Uterus is unremarkable. Adnexa not well assessed due
to adjacent bowel, no evidence of adnexal mass. Small amount of free
fluid in the pelvis likely physiologic.

Other: No upper abdominal ascites or free air. No intra-abdominal
abscess.

Musculoskeletal: There are no acute or suspicious osseous
abnormalities.
IMPRESSION: Stomach distended with fluid and ingested contents. Lobular
hyperdensities in the pre pyloric stomach likely ingested material,
however underlying gastric mass could have a similar appearance.
Consider endoscopic evaluation.

## 2019-12-26 ENCOUNTER — Encounter: Payer: Self-pay | Admitting: Physician Assistant

## 2019-12-26 ENCOUNTER — Ambulatory Visit: Payer: Medicaid Other | Admitting: Physician Assistant

## 2019-12-26 ENCOUNTER — Other Ambulatory Visit: Payer: Self-pay

## 2019-12-26 DIAGNOSIS — B9689 Other specified bacterial agents as the cause of diseases classified elsewhere: Secondary | ICD-10-CM | POA: Diagnosis not present

## 2019-12-26 DIAGNOSIS — N760 Acute vaginitis: Secondary | ICD-10-CM | POA: Diagnosis not present

## 2019-12-26 DIAGNOSIS — Z113 Encounter for screening for infections with a predominantly sexual mode of transmission: Secondary | ICD-10-CM | POA: Diagnosis not present

## 2019-12-26 LAB — PREGNANCY, URINE: Preg Test, Ur: NEGATIVE

## 2019-12-26 LAB — WET PREP FOR TRICH, YEAST, CLUE
Trichomonas Exam: NEGATIVE
Yeast Exam: NEGATIVE

## 2019-12-26 MED ORDER — THERA VITAL M PO TABS: 1.0000 | ORAL_TABLET | Freq: Every day | ORAL | 0 refills | Status: AC

## 2019-12-26 MED ORDER — METRONIDAZOLE 250 MG PO TABS: 250.0000 mg | ORAL_TABLET | Freq: Three times a day (TID) | ORAL | 0 refills | Status: AC

## 2019-12-26 NOTE — Progress Notes (Signed)
PT negative. Wet mount reviewed, patient treated for BV per provider orders. Planning healthy pregnancy booklet and MVI given today.Burt Knack, RN

## 2019-12-26 NOTE — Progress Notes (Signed)
Here for STD testing. Thinks her period is a day late. C/O stomach pains from something she ate.Burt Knack, RN

## 2019-12-27 NOTE — Progress Notes (Signed)
[facility] Department STI clinic/screening visit  Subjective:  Debra Mahoney is a 24 y.o. female being seen today for an STI screening visit. The patient reports they do have symptoms.  Patient reports that they are not sure but may desire a pregnancy in the next year.   They reported they are not interested in discussing contraception today.  Patient's last menstrual period was 11/29/2019 (exact date).   Patient has the following medical conditions:   Patient Active Problem List   Diagnosis Date Noted  . Bipolar 1 disorder (HCC) 03/15/2018  . Adjustment disorder with mixed anxiety and depressed mood 03/15/2018    Chief Complaint  Patient presents with  . SEXUALLY TRANSMITTED DISEASE    HPI  Patient reports that she has had a white discharge with an odor for about 2 weeks. Requests a pregnancy test stating that her period is a day late. Reports a history of Bipolar disorder and appendectomy.  Denies current regular medications or counseling.   See flowsheet for further details and programmatic requirements.    The following portions of the patient's history were reviewed and updated as appropriate: allergies, current medications, past medical history, past social history, past surgical history and problem list.  Objective:  There were no vitals filed for this visit.  Physical Exam Constitutional:      General: She is not in acute distress.    Appearance: Normal appearance.  HENT:     Head: Normocephalic and atraumatic.     Comments: No nits, lice, or hair loss. No cervical, supraclavicular or axillary adenopathy.    Mouth/Throat:     Mouth: Mucous membranes are moist.     Pharynx: Oropharynx is clear. No oropharyngeal exudate or posterior oropharyngeal erythema.  Eyes:     Conjunctiva/sclera: Conjunctivae normal.  Pulmonary:     Effort: Pulmonary effort is normal.  Abdominal:     Palpations: Abdomen is soft. There is no mass.     Tenderness: There is no  abdominal tenderness. There is no guarding or rebound.  Genitourinary:    General: Normal vulva.     Rectum: Normal.     Comments: External genitalia/pubic area without nits, lice, edema, erythema, lesions and inguinal adenopathy. Vagina with normal mucosa, small amount of thin, white discharge, pH=4.5. Cervix without visible lesions. Uterus firm, mobile, nt, no masses, no CMT, no adnexal tenderness or fullness. Musculoskeletal:     Cervical back: Neck supple. No tenderness.  Skin:    General: Skin is warm and dry.     Findings: No bruising, erythema, lesion or rash.  Neurological:     Mental Status: She is alert and oriented to person, place, and time.  Psychiatric:        Mood and Affect: Mood normal.        Behavior: Behavior normal.        Thought Content: Thought content normal.        Judgment: Judgment normal.      Assessment and Plan:  Debra Mahoney is a 24 y.o. female presenting to the [facility] Department for STI screening  1. Screening for STD (sexually transmitted disease) Patient into clinic with symptoms. Rec condoms with all sex. Await test results.  Counseled that RN will call if needs to RTC for further treatment once results are back. Counseled re:  Accuracy of pregnancy test and that negative today means not pregnant [redacted] weeks ago.  Also, counseled that it has only been 27 days since the first day  of the last period and would not yet be considered late unless cycles are usually q 21 days.   - WET PREP FOR TRICH, YEAST, CLUE - Gonococcus culture - Chlamydia/Gonorrhea [facility] - HIV [place] [facility] - Syphilis Serology, [facility] - Pregnancy, urine - Multiple Vitamins-Minerals (MULTIVITAMIN) tablet; Take 1 tablet by mouth daily.  Dispense: 100 tablet; Refill: 0  2. BV (bacterial vaginosis) Treat for BV with Metronidazole 250mg  #21 1 po TID for 7 days with food, no EtOH for 24 hr before and until 72 hr after completing medicine, No sex for 7  days Rec using OTC antifungal cream if has itching during or just after taking antibiotics. - metroNIDAZOLE (FLAGYL) 250 MG tablet; Take 1 tablet (250 mg total) by mouth 3 (three) times daily for 7 days.  Dispense: 21 tablet; Refill: 0     No follow-ups on file.  No future appointments.  Jerene Dilling, PA

## 2019-12-31 LAB — GONOCOCCUS CULTURE

## 2020-01-01 ENCOUNTER — Telehealth: Payer: Self-pay

## 2020-01-01 NOTE — Telephone Encounter (Signed)
TC to patient. Verified ID via password/SS#. Informed of positive chlamydia and need for tx. Instructed to eat before visit and have partner call for tx appt. Appt scheduled.Chasitee Zenker, RN    

## 2020-01-03 ENCOUNTER — Telehealth: Payer: Self-pay

## 2020-01-03 DIAGNOSIS — A749 Chlamydial infection, unspecified: Secondary | ICD-10-CM

## 2020-01-03 NOTE — Telephone Encounter (Signed)
Attempted TC to patient re: missed Tx appt on 01/01/20. LM to call RN back or appt line to schedule tx appt Richmond Campbell, RN   Lab sent for scanning.

## 2020-01-29 ENCOUNTER — Ambulatory Visit: Payer: Medicaid Other

## 2020-02-05 ENCOUNTER — Emergency Department
Admission: EM | Admit: 2020-02-05 | Discharge: 2020-02-05 | Disposition: A | Discharge status: Home / Self Care | Payer: Medicaid Other | Attending: Emergency Medicine | Admitting: Emergency Medicine

## 2020-02-05 ENCOUNTER — Emergency Department: Payer: Medicaid Other

## 2020-02-05 ENCOUNTER — Encounter: Payer: Self-pay | Admitting: Emergency Medicine

## 2020-02-05 ENCOUNTER — Other Ambulatory Visit: Payer: Self-pay

## 2020-02-05 DIAGNOSIS — Z79899 Other long term (current) drug therapy: Secondary | ICD-10-CM | POA: Insufficient documentation

## 2020-02-05 DIAGNOSIS — R0982 Postnasal drip: Secondary | ICD-10-CM | POA: Diagnosis not present

## 2020-02-05 DIAGNOSIS — R059 Cough, unspecified: Secondary | ICD-10-CM

## 2020-02-05 DIAGNOSIS — F17210 Nicotine dependence, cigarettes, uncomplicated: Secondary | ICD-10-CM | POA: Diagnosis not present

## 2020-02-05 DIAGNOSIS — R05 Cough: Secondary | ICD-10-CM | POA: Diagnosis not present

## 2020-02-05 DIAGNOSIS — J0100 Acute maxillary sinusitis, unspecified: Secondary | ICD-10-CM | POA: Insufficient documentation

## 2020-02-05 DIAGNOSIS — Z91040 Latex allergy status: Secondary | ICD-10-CM | POA: Insufficient documentation

## 2020-02-05 DIAGNOSIS — R0981 Nasal congestion: Secondary | ICD-10-CM | POA: Diagnosis present

## 2020-02-05 LAB — POCT PREGNANCY, URINE: Preg Test, Ur: NEGATIVE

## 2020-02-05 MED ORDER — BENZONATATE 100 MG PO CAPS: 200.0000 mg | ORAL_CAPSULE | Freq: Three times a day (TID) | ORAL | 0 refills | Status: AC | PRN

## 2020-02-05 MED ORDER — FEXOFENADINE-PSEUDOEPHED ER 60-120 MG PO TB12: 1.0000 | ORAL_TABLET | Freq: Two times a day (BID) | ORAL | 0 refills | Status: AC

## 2020-02-05 MED ORDER — AMOXICILLIN 875 MG PO TABS: 875.0000 mg | ORAL_TABLET | Freq: Two times a day (BID) | ORAL | 0 refills | Status: AC

## 2020-02-05 NOTE — ED Triage Notes (Signed)
Patient ambulatory to triage with steady gait, without difficulty or distress noted, mask in place; pt reports x 6 days having sinus drainage and nonprod cough

## 2020-02-05 NOTE — ED Notes (Signed)
See triage note  Presents with cough and some wheezing   Stats she has a hx of asthma  Is out of her inhaler  Also stats she has had a lot of sinus drainage  Denies any fever

## 2020-02-05 NOTE — ED Provider Notes (Signed)
[facility] Emergency Department Provider Note   ____________________________________________   First MD Initiated Contact with Patient 02/05/20 [phone]     (approximate)  I have reviewed the triage vital signs and the nursing notes.   HISTORY  Chief Complaint Cough    HPI Debra Mahoney is a 24 y.o. female patient presents with 1 week of sinus congestion postnasal drainage.  Patient also has productive cough.  Patient denies fever/chills.  Patient denies body ache.  No recent travel or known contact with COVID-19.  Patient states she had contacted COVID-19 2 months ago.         Past Medical History:  Diagnosis Date  . Bipolar 1 disorder (HCC)   . Schizophrenia [facility])     Patient Active Problem List   Diagnosis Date Noted  . Bipolar 1 disorder (HCC) 03/15/2018  . Adjustment disorder with mixed anxiety and depressed mood 03/15/2018    Past Surgical History:  Procedure Laterality Date  . APPENDECTOMY      Prior to Admission medications   Medication Sig Start Date End Date Taking? Authorizing Provider  amoxicillin (AMOXIL) 875 MG tablet Take 1 tablet (875 mg total) by mouth 2 (two) times daily. 02/05/20   Joni Reining, PA-C  benzonatate (TESSALON PERLES) 100 MG capsule Take 2 capsules (200 mg total) by mouth 3 (three) times daily as needed. 02/05/20 02/04/21  Joni Reining, PA-C  benztropine (COGENTIN) 1 MG tablet Take 1 mg by mouth 2 (two) times daily.    [provider]  fexofenadine-pseudoephedrine (ALLEGRA-D) 60-120 MG 12 hr tablet Take 1 tablet by mouth 2 (two) times daily. 02/05/20   Joni Reining, PA-C  haloperidol (HALDOL) 2 MG tablet Take 2 mg by mouth 2 (two) times daily.    [provider]  haloperidol (HALDOL) 5 MG tablet Take 5 mg by mouth every evening.    [provider]  Multiple Vitamins-Minerals (MULTIVITAMIN) tablet Take 1 tablet by mouth daily. 12/26/19   Federico Flake, MD  sertraline (ZOLOFT)  50 MG tablet Take 150 mg by mouth daily.    [provider]    Allergies Latex, Shellfish allergy, and Risperdal [risperidone]  Family History  Problem Relation Age of Onset  . Bipolar disorder Mother   . Bipolar disorder Father     Social History Social History   Tobacco Use  . Smoking status: Current Every Day Smoker    Packs/day: 1.00    Years: 3.00    Pack years: 3.00    Types: Cigarettes  . Smokeless tobacco: Never Used  Substance Use Topics  . Alcohol use: Not Currently  . Drug use: Not Currently    Review of Systems  Constitutional: No fever/chills Eyes: No visual changes. ENT: No sore throat.  Nasal congestion. Cardiovascular: Denies chest pain. Respiratory: Denies shortness of breath.  Productive cough. Gastrointestinal: No abdominal pain.  No nausea, no vomiting.  No diarrhea.  No constipation. Genitourinary: Negative for dysuria. Musculoskeletal: Negative for back pain. Skin: Negative for rash. Neurological: Negative for headaches, focal weakness or numbness. Psychiatric:  Adjustment disorder, anxiety, and bipolar. Endocrine:  Hematological/Lymphatic:  Allergic/Immunilogical: Latex, shellfish, and Risperdal____________________________________________   PHYSICAL EXAM:  VITAL SIGNS: ED Triage Vitals  Enc Vitals Group     BP 02/05/20 0517 131/71     Pulse Rate 02/05/20 0517 (!) 102     Resp 02/05/20 0517 18     Temp 02/05/20 0517 98.9 F (37.2 C)     Temp Source  02/05/20 0517 Oral     SpO2 02/05/20 0517 97 %     Weight 02/05/20 0514 212 lb 4.8 oz (96.3 kg)     Height 02/05/20 0514 5\' 6"  (1.676 m)     Head Circumference --      Peak Flow --      Pain Score 02/05/20 0513 0     Pain Loc --      Pain Edu? --      Excl. in GC? --     Constitutional: Alert and oriented. Well appearing and in no acute distress. Eyes: Conjunctivae are normal. PERRL. EOMI. Head: Atraumatic. Nose: Edematous nasal turbinates thick rhinorrhea.  Bilateral  maxillary guarding. Mouth/Throat: Mucous membranes are moist.  Oropharynx non-erythematous.  Postnasal drainage. Neck: No stridor.   Hematological/Lymphatic/Immunilogical: No cervical lymphadenopathy. Cardiovascular: Normal rate, regular rhythm. Grossly normal heart sounds.  Good peripheral circulation. Respiratory: Normal respiratory effort.  No retractions. Lungs CTAB.  Productive cough. Genitourinary: Deferred Skin:  Skin is warm, dry and intact. No rash noted. Psychiatric: Mood and affect are normal. Speech and behavior are normal.  ____________________________________________   LABS (all labs ordered are listed, but only abnormal results are displayed)  Labs Reviewed  POCT PREGNANCY, URINE   ____________________________________________  EKG   ____________________________________________  RADIOLOGY  ED MD interpretation:    Official radiology report(s): DG Chest 2 View  Result Date: 02/05/2020 CLINICAL DATA:  Cough EXAM: CHEST - 2 VIEW COMPARISON:  None. FINDINGS: The heart size and mediastinal contours are within normal limits. Both lungs are clear. The visualized skeletal structures are unremarkable. IMPRESSION: No active cardiopulmonary disease. Electronically Signed   By: 04/06/2020 M.D.   On: 02/05/2020 05:43    ____________________________________________   PROCEDURES  Procedure(s) performed (including Critical Care):  Procedures   ____________________________________________   INITIAL IMPRESSION / ASSESSMENT AND PLAN / ED COURSE  As part of my medical decision making, I reviewed the following data within the electronic MEDICAL RECORD NUMBER     Patient presents with 4-week of nasal congestion, postnasal drainage, and productive cough.  Discussed no acute findings on chest x-ray.  Patient complaint physical exam is consistent with maxillary sinusitis and cough secondary to postnasal drainage.  Patient given discharge care instruction work note.  Patient  advised take medication as directed and follow-up with PCP.    Debra Mahoney was evaluated in Emergency Department on 02/05/2020 for the symptoms described in the history of present illness. She was evaluated in the context of the global COVID-19 pandemic, which necessitated consideration that the patient might be at risk for infection with the SARS-CoV-2 virus that causes COVID-19. Institutional protocols and algorithms that pertain to the evaluation of patients at risk for COVID-19 are in a state of rapid change based on information released by regulatory bodies including the CDC and federal and state organizations. These policies and algorithms were followed during the patient's care in the ED.       ____________________________________________   FINAL CLINICAL IMPRESSION(S) / ED DIAGNOSES  Final diagnoses:  Subacute maxillary sinusitis  Cough     ED Discharge Orders         Ordered    fexofenadine-pseudoephedrine (ALLEGRA-D) 60-120 MG 12 hr tablet  2 times daily     02/05/20 0721    amoxicillin (AMOXIL) 875 MG tablet  2 times daily     02/05/20 0721    benzonatate (TESSALON PERLES) 100 MG capsule  3 times daily PRN     02/05/20 04/06/20  Note:  This document was prepared using Dragon voice recognition software and may include unintentional dictation errors.    Sable Feil, PA-C 02/05/20 2952    Carrie Mew, MD 02/05/20 [phone]

## 2020-02-05 NOTE — Discharge Instructions (Signed)
Follow discharge care instruction take medication as directed. °

## 2020-03-17 ENCOUNTER — Other Ambulatory Visit: Payer: Self-pay

## 2020-03-17 DIAGNOSIS — Y929 Unspecified place or not applicable: Secondary | ICD-10-CM | POA: Diagnosis not present

## 2020-03-17 DIAGNOSIS — Y999 Unspecified external cause status: Secondary | ICD-10-CM | POA: Insufficient documentation

## 2020-03-17 DIAGNOSIS — Y939 Activity, unspecified: Secondary | ICD-10-CM | POA: Diagnosis not present

## 2020-03-17 DIAGNOSIS — S0181XA Laceration without foreign body of other part of head, initial encounter: Secondary | ICD-10-CM | POA: Insufficient documentation

## 2020-03-17 DIAGNOSIS — M7918 Myalgia, other site: Secondary | ICD-10-CM | POA: Insufficient documentation

## 2020-03-17 DIAGNOSIS — Z5321 Procedure and treatment not carried out due to patient leaving prior to being seen by health care provider: Secondary | ICD-10-CM | POA: Diagnosis not present
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
## 2020-03-18 ENCOUNTER — Emergency Department
Admission: EM | Admit: 2020-03-18 | Discharge: 2020-03-18 | Disposition: A | Discharge status: Home / Self Care | Payer: Medicaid Other | Attending: Emergency Medicine | Admitting: Emergency Medicine

## 2020-03-18 ENCOUNTER — Emergency Department: Payer: Medicaid Other

## 2020-03-18 LAB — POCT PREGNANCY, URINE: Preg Test, Ur: NEGATIVE

## 2020-04-23 ENCOUNTER — Telehealth: Payer: Self-pay | Admitting: Family Medicine

## 2020-04-23 NOTE — Telephone Encounter (Signed)
Pt. is stating she needs to get her rx for Clymmidia from her last visit here.

## 2020-04-24 NOTE — Telephone Encounter (Signed)
Discussed with provider. Per Sadie Haber, PA, if pt c/o abdominal pain, fever, vaginal discharge, or any new symptoms, schedule in provider clinic for reassessment.  If not complaining of symptoms, okay to schedule tx appt in RN clinic and tx per standing order.

## 2020-04-24 NOTE — Telephone Encounter (Signed)
Phone call to pt. Female answered phone and stated pt was not available and could she take a message. Left message that Dr's office is just trying to return a phone call to pt.

## 2020-04-24 NOTE — Telephone Encounter (Signed)
Phone call to pt. Left message on voicemail that RN with [facility] is returning phone call, if still need assistance please call us back at [phone]. °

## 2020-04-24 NOTE — Telephone Encounter (Signed)
Consulted by RN re:  patient needs tx for infection and has been several months since patient had last visit.  Reviewed RN note and agree that it reflects discussion and recommendations made by me.

## 2020-04-25 ENCOUNTER — Ambulatory Visit: Payer: Medicaid Other

## 2020-04-25 NOTE — Telephone Encounter (Signed)
Phone call to pt. Left message on voicemail that RN with [facility] is trying to return call, if still need assistance call [phone].

## 2020-10-17 ENCOUNTER — Ambulatory Visit: Payer: Medicaid Other

## 2020-11-17 ENCOUNTER — Emergency Department (HOSPITAL_COMMUNITY)
Admission: EM | Admit: 2020-11-17 | Discharge: 2020-11-17 | Disposition: A | Discharge status: Home / Self Care | Payer: Medicaid Other | Attending: Emergency Medicine | Admitting: Emergency Medicine

## 2020-11-17 ENCOUNTER — Other Ambulatory Visit: Payer: Self-pay

## 2020-11-17 ENCOUNTER — Encounter (HOSPITAL_COMMUNITY): Payer: Self-pay | Admitting: *Deleted

## 2020-11-17 DIAGNOSIS — Z3202 Encounter for pregnancy test, result negative: Secondary | ICD-10-CM | POA: Insufficient documentation

## 2020-11-17 DIAGNOSIS — N898 Other specified noninflammatory disorders of vagina: Secondary | ICD-10-CM | POA: Diagnosis present

## 2020-11-17 DIAGNOSIS — B373 Candidiasis of vulva and vagina: Secondary | ICD-10-CM | POA: Diagnosis not present

## 2020-11-17 DIAGNOSIS — Z91040 Latex allergy status: Secondary | ICD-10-CM | POA: Insufficient documentation

## 2020-11-17 DIAGNOSIS — F17210 Nicotine dependence, cigarettes, uncomplicated: Secondary | ICD-10-CM | POA: Diagnosis not present

## 2020-11-17 DIAGNOSIS — R300 Dysuria: Secondary | ICD-10-CM | POA: Diagnosis not present

## 2020-11-17 DIAGNOSIS — B3731 Acute candidiasis of vulva and vagina: Secondary | ICD-10-CM

## 2020-11-17 LAB — WET PREP, GENITAL
Clue Cells Wet Prep HPF POC: NONE SEEN
Sperm: NONE SEEN
Trich, Wet Prep: NONE SEEN

## 2020-11-17 LAB — URINALYSIS, ROUTINE W REFLEX MICROSCOPIC
Bacteria, UA: NONE SEEN
Bilirubin Urine: NEGATIVE
Glucose, UA: NEGATIVE mg/dL
Hgb urine dipstick: NEGATIVE
Ketones, ur: 5 mg/dL — AB
Nitrite: NEGATIVE
Protein, ur: NEGATIVE mg/dL
Specific Gravity, Urine: 1.011 (ref 1.005–1.030)
pH: 7 (ref 5.0–8.0)

## 2020-11-17 LAB — PREGNANCY, URINE: Preg Test, Ur: NEGATIVE

## 2020-11-17 LAB — HIV ANTIBODY (ROUTINE TESTING W REFLEX): HIV Screen 4th Generation wRfx: NONREACTIVE

## 2020-11-17 MED ORDER — FLUCONAZOLE 150 MG PO TABS: 150.0000 mg | ORAL_TABLET | Freq: Every day | ORAL | 0 refills | Status: AC

## 2020-11-17 NOTE — Discharge Instructions (Addendum)
Your pregnancy test was negative  Your swab did show yeast infection.  We have given you a prescription for an antibiotic.  Take 1 dose today.  If you continue to have symptoms 48 hours from now you can take another tablet.  Gonorrhea, chlamydia, HIV and syphilis test take 48 hours to return.  If positive you will be called and will need follow-up at that point.

## 2020-11-17 NOTE — ED Triage Notes (Signed)
Pt with burning with urination x 2 days. Hx of UTI in the past.

## 2020-11-17 NOTE — ED Provider Notes (Signed)
[facility] EMERGENCY DEPARTMENT Provider Note   CSN: [id number] Arrival date & time: 11/17/20  1338     History Chief Complaint  Patient presents with  . Dysuria    Debra Mahoney is a 25 y.o. female with history significant for bipolar disorder who presents for evaluation of vaginal discharge.  Began 3 days ago.  Has had some mild burning with urination.  She is sexually active.  Has had prior STDs in the past.  States her vaginal discharge is thick and white in color.  Also has some mild vaginal pruritus.  She would like to be checked for STDs.  She states her last menstrual cycle was approximately 2 months ago.  There is a chance she may be pregnant.  She is not on any form of birth control.  No fever, chills, nausea, vomiting, chest pain, shortness of breath, abdominal pain, vaginal bleeding, diarrhea, constipation or weakness.  Denies additional aggravating or alleviating factors.  History obtained from patient and past medical records.  No interpreter used.  HPI     Past Medical History:  Diagnosis Date  . Bipolar 1 disorder (HCC)   . Schizophrenia [facility])     Patient Active Problem List   Diagnosis Date Noted  . Bipolar 1 disorder (HCC) 03/15/2018  . Adjustment disorder with mixed anxiety and depressed mood 03/15/2018    Past Surgical History:  Procedure Laterality Date  . APPENDECTOMY       OB History   No obstetric history on file.     Family History  Problem Relation Age of Onset  . Bipolar disorder Mother   . Bipolar disorder Father     Social History   Tobacco Use  . Smoking status: Current Every Day Smoker    Packs/day: 1.00    Years: 3.00    Pack years: 3.00    Types: Cigarettes  . Smokeless tobacco: Never Used  Vaping Use  . Vaping Use: Never used  Substance Use Topics  . Alcohol use: Not Currently  . Drug use: Yes    Types: Marijuana    Home Medications Prior to Admission medications   Medication Sig Start Date End Date Taking?  Authorizing Provider  fluconazole (DIFLUCAN) 150 MG tablet Take 1 tablet (150 mg total) by mouth daily for 3 doses. 11/17/20 11/20/20 Yes Henderly, Britni A, PA-C  amoxicillin (AMOXIL) 875 MG tablet Take 1 tablet (875 mg total) by mouth 2 (two) times daily. 02/05/20   Joni Reining, PA-C  benzonatate (TESSALON PERLES) 100 MG capsule Take 2 capsules (200 mg total) by mouth 3 (three) times daily as needed. 02/05/20 02/04/21  Joni Reining, PA-C  benztropine (COGENTIN) 1 MG tablet Take 1 mg by mouth 2 (two) times daily.    [provider]  fexofenadine-pseudoephedrine (ALLEGRA-D) 60-120 MG 12 hr tablet Take 1 tablet by mouth 2 (two) times daily. 02/05/20   Joni Reining, PA-C  haloperidol (HALDOL) 2 MG tablet Take 2 mg by mouth 2 (two) times daily.    [provider]  haloperidol (HALDOL) 5 MG tablet Take 5 mg by mouth every evening.    [provider]  Multiple Vitamins-Minerals (MULTIVITAMIN) tablet Take 1 tablet by mouth daily. 12/26/19   Federico Flake, MD  sertraline (ZOLOFT) 50 MG tablet Take 150 mg by mouth daily.    [provider]    Allergies    Latex, Shellfish allergy, and Risperdal [risperidone]  Review of Systems   Review of Systems  Constitutional:  Negative.   HENT: Negative.   Respiratory: Negative.   Cardiovascular: Negative.   Gastrointestinal: Negative.   Genitourinary: Positive for dysuria, menstrual problem and vaginal discharge. Negative for decreased urine volume, difficulty urinating, flank pain, frequency, hematuria, pelvic pain, urgency, vaginal bleeding and vaginal pain.  Musculoskeletal: Negative.   Skin: Negative.   Neurological: Negative.   All other systems reviewed and are negative.   Physical Exam Updated Vital Signs BP 122/61 (BP Location: Right Arm)   Pulse 98   Temp 98 F (36.7 C) (Oral)   Resp 14   Ht 5\' 6"  (1.676 m)   Wt 100.2 kg   LMP 10/04/2020   SpO2 100%   BMI 35.67 kg/m   Physical Exam Vitals  and nursing note reviewed. Exam conducted with a chaperone present.  Constitutional:      General: She is not in acute distress.    Appearance: She is well-developed. She is not ill-appearing, toxic-appearing or diaphoretic.  HENT:     Head: Normocephalic and atraumatic.     Nose: Nose normal.     Mouth/Throat:     Mouth: Mucous membranes are moist.  Eyes:     Pupils: Pupils are equal, round, and reactive to light.  Cardiovascular:     Rate and Rhythm: Normal rate.     Pulses: Normal pulses.     Heart sounds: Normal heart sounds.  Pulmonary:     Effort: Pulmonary effort is normal. No respiratory distress.     Breath sounds: Normal breath sounds.  Abdominal:     General: Bowel sounds are normal. There is no distension.     Tenderness: There is no abdominal tenderness. There is no right CVA tenderness, left CVA tenderness or guarding.  Genitourinary:    Comments: Normal appearing external female genitalia without rashes or lesions, erythematous vaginal epithelium. Normal appearing cervix with thick white discharge. No cervical petechiae. Cervical os is closed. There is no bleeding noted at the os. No odor.   No palpable adnexal masses or tenderness. Uterus midline and not fixed. Rectovaginal exam was defered.  No cystocele or rectocele noted. No pelvic lymphadenopathy noted. Wet prep was obtained.  Cultures for gonorrhea and chlamydia collected. Exam performed with chaperone in room. Musculoskeletal:        General: Normal range of motion.     Cervical back: Normal range of motion.  Skin:    General: Skin is warm and dry.     Capillary Refill: Capillary refill takes less than 2 seconds.  Neurological:     General: No focal deficit present.     Mental Status: She is alert and oriented to person, place, and time.     ED Results / Procedures / Treatments   Labs (all labs ordered are listed, but only abnormal results are displayed) Labs Reviewed  WET PREP, GENITAL - Abnormal; Notable  for the following components:      Result Value   Yeast Wet Prep HPF POC PRESENT (*)    WBC, Wet Prep HPF POC FEW (*)    All other components within normal limits  URINALYSIS, ROUTINE W REFLEX MICROSCOPIC - Abnormal; Notable for the following components:   APPearance HAZY (*)    Ketones, ur 5 (*)    Leukocytes,Ua TRACE (*)    All other components within normal limits  PREGNANCY, URINE  RPR  HIV ANTIBODY (ROUTINE TESTING W REFLEX)  GC/CHLAMYDIA PROBE AMP ([place]) NOT AT [facility]    EKG None  Radiology No results  found.  Procedures Procedures   Medications Ordered in ED Medications - No data to display  ED Course  I have reviewed the triage vital signs and the nursing notes.  Pertinent labs & imaging results that were available during my care of the patient were reviewed by me and considered in my medical decision making (see chart for details).  Patient here for STD check as well as pregnancy test.  She is afebrile, nonseptic, not ill-appearing.  Has thick, white vaginal discharge and vaginal pruritus.  LMP 2 months ago.  She is on a form of birth control.  Heart and lungs clear.  And soft, nontender.  Does have some mild dysuria.  Will check urine, prior, STD screen, Pelvic exam and reassessed.  UA negative for UTI Preg negative Wet prep With year, few WBC  Patient reassessed. Pelvis with thick, white discharge in vaginal vault. No CMT tenderness, no adnexal tenderness. Low suspicion for PID, Torsion.  Patient reassessed.  Discussed yeast.  Will treat for yeast infection.  Discussed she will be called with results of STD testing will need follow-up at that time.  She is agreeable to this.  The patient has been appropriately medically screened and/or stabilized in the ED. I have low suspicion for any other emergent medical condition which would require further screening, evaluation or treatment in the ED or require inpatient management.  Patient is hemodynamically stable  and in no acute distress.  Patient able to ambulate in department prior to ED.  Evaluation does not show acute pathology that would require ongoing or additional emergent interventions while in the emergency department or further inpatient treatment.  I have discussed the diagnosis with the patient and answered all questions.  Pain is been managed while in the emergency department and patient has no further complaints prior to discharge.  Patient is comfortable with plan discussed in room and is stable for discharge at this time.  I have discussed strict return precautions for returning to the emergency department.  Patient was encouraged to follow-up with PCP/specialist refer to at discharge.    MDM Rules/Calculators/A&P                           Final Clinical Impression(s) / ED Diagnoses Final diagnoses:  Yeast vaginitis  Pregnancy test negative    Rx / DC Orders ED Discharge Orders         Ordered    fluconazole (DIFLUCAN) 150 MG tablet  Daily        11/17/20 1504           Henderly, Britni A, PA-C 11/17/20 1517    Vanetta Mulders, MD 11/25/20 [phone]

## 2020-11-18 LAB — GC/CHLAMYDIA PROBE AMP (~~LOC~~) NOT AT ARMC
Chlamydia: NEGATIVE
Comment: NEGATIVE
Comment: NORMAL
Neisseria Gonorrhea: NEGATIVE

## 2020-11-18 LAB — RPR: RPR Ser Ql: NONREACTIVE

## 2020-12-10 ENCOUNTER — Ambulatory Visit: Payer: Medicaid Other

## 2020-12-12 ENCOUNTER — Other Ambulatory Visit: Payer: Self-pay

## 2020-12-12 ENCOUNTER — Encounter (HOSPITAL_COMMUNITY): Payer: Self-pay | Admitting: *Deleted

## 2020-12-12 ENCOUNTER — Emergency Department (HOSPITAL_COMMUNITY)
Admission: EM | Admit: 2020-12-12 | Discharge: 2020-12-12 | Discharge status: Against Medical Advice | Payer: Medicaid Other | Attending: Emergency Medicine | Admitting: Emergency Medicine

## 2020-12-12 DIAGNOSIS — N939 Abnormal uterine and vaginal bleeding, unspecified: Secondary | ICD-10-CM | POA: Diagnosis not present

## 2020-12-12 DIAGNOSIS — Z3201 Encounter for pregnancy test, result positive: Secondary | ICD-10-CM | POA: Diagnosis not present

## 2020-12-12 DIAGNOSIS — R109 Unspecified abdominal pain: Secondary | ICD-10-CM | POA: Insufficient documentation

## 2020-12-12 DIAGNOSIS — Z5321 Procedure and treatment not carried out due to patient leaving prior to being seen by health care provider: Secondary | ICD-10-CM | POA: Insufficient documentation

## 2020-12-12 LAB — CBC WITH DIFFERENTIAL/PLATELET
Abs Immature Granulocytes: 0.03 10*3/uL (ref 0.00–0.07)
Basophils Absolute: 0 10*3/uL (ref 0.0–0.1)
Basophils Relative: 0 %
Eosinophils Absolute: 0.1 10*3/uL (ref 0.0–0.5)
Eosinophils Relative: 1 %
HCT: 36.6 % (ref 36.0–46.0)
Hemoglobin: 11.4 g/dL — ABNORMAL LOW (ref 12.0–15.0)
Immature Granulocytes: 0 %
Lymphocytes Relative: 27 %
Lymphs Abs: 2.1 10*3/uL (ref 0.7–4.0)
MCH: 23.8 pg — ABNORMAL LOW (ref 26.0–34.0)
MCHC: 31.1 g/dL (ref 30.0–36.0)
MCV: 76.4 fL — ABNORMAL LOW (ref 80.0–100.0)
Monocytes Absolute: 0.8 10*3/uL (ref 0.1–1.0)
Monocytes Relative: 10 %
Neutro Abs: 4.7 10*3/uL (ref 1.7–7.7)
Neutrophils Relative %: 62 %
Platelets: 295 10*3/uL (ref 150–400)
RBC: 4.79 MIL/uL (ref 3.87–5.11)
RDW: 15.8 % — ABNORMAL HIGH (ref 11.5–15.5)
WBC: 7.7 10*3/uL (ref 4.0–10.5)
nRBC: 0 % (ref 0.0–0.2)

## 2020-12-12 LAB — BASIC METABOLIC PANEL
Anion gap: 8 (ref 5–15)
BUN: 8 mg/dL (ref 6–20)
CO2: 26 mmol/L (ref 22–32)
Calcium: 8.6 mg/dL — ABNORMAL LOW (ref 8.9–10.3)
Chloride: 106 mmol/L (ref 98–111)
Creatinine, Ser: 0.66 mg/dL (ref 0.44–1.00)
GFR, Estimated: >60 mL/min (ref >60)
Glucose, Bld: 102 mg/dL — ABNORMAL HIGH (ref 70–99)
Potassium: 3.8 mmol/L (ref 3.5–5.1)
Sodium: 140 mmol/L (ref 135–145)

## 2020-12-12 LAB — HCG, QUANTITATIVE, PREGNANCY: hCG, Beta Chain, Quant, S: 1 m[IU]/mL (ref <5)

## 2020-12-12 NOTE — ED Provider Notes (Cosign Needed)
MSE was initiated and I personally evaluated the patient and placed orders (if any) at  7:34 PM on December 12, 2020.  Emergency Medicine Provider Triage Evaluation Note  Debra Mahoney , a 25 y.o. female  was evaluated in triage.  Pt complains of blood in urine. + preg test 2 weeks ago, Plus fatigue, vomiting, breast tenderness. # days of suprapubic pain - today saw blood in urine.  Review of Systems  Positive: hematuria Negative: Fever, flank pain  Physical Exam  Ht 5\' 6"  (1.676 m)   Wt 103.4 kg   LMP  (LMP Unknown) Comment: + home pregancy test, LMP back in Jan.   BMI 36.80 kg/m  Gen:   Awake, no distress   HEENT:  Atraumatic  Resp:  Normal effort  Cardiac:  Normal rate  Abd:   Suprapubic tenderness MSK:   Moves extremities without difficulty  Neuro:  Speech clear   Medical Decision Making  Medically screening exam initiated at 7:34 PM.  Appropriate orders placed.  Debra Mahoney was informed that the remainder of the evaluation will be completed by another provider, this initial triage assessment does not replace that evaluation, and the importance of remaining in the ED until their evaluation is complete.  Clinical Impression  Patient with Hematuria- ? Pregnancy Suspect UTI- No vaginal bleeding UA, labs pending.    Delton See, PA-C 12/12/20 1946

## 2020-12-12 NOTE — ED Triage Notes (Signed)
Pt with abd pain and spotting for past 3 days.  Pt took home pregnancy test and was positive.  Today had some vaginal bleeding, denies any clots noted.  abd cramping at present. This is pt's second pregnancy and has an appt next week.  Unsure how far along she is but last menses was back in January .

## 2020-12-15 ENCOUNTER — Emergency Department (HOSPITAL_COMMUNITY)
Admission: EM | Admit: 2020-12-15 | Discharge: 2020-12-15 | Discharge status: Against Medical Advice | Payer: Medicaid Other

## 2020-12-17 ENCOUNTER — Ambulatory Visit: Payer: Medicaid Other

## 2020-12-19 ENCOUNTER — Emergency Department (HOSPITAL_COMMUNITY)
Admission: EM | Admit: 2020-12-19 | Discharge: 2020-12-19 | Disposition: A | Discharge status: Home / Self Care | Payer: Medicaid Other | Attending: Emergency Medicine | Admitting: Emergency Medicine

## 2020-12-19 ENCOUNTER — Encounter (HOSPITAL_COMMUNITY): Payer: Self-pay

## 2020-12-19 ENCOUNTER — Other Ambulatory Visit: Payer: Self-pay

## 2020-12-19 DIAGNOSIS — N939 Abnormal uterine and vaginal bleeding, unspecified: Secondary | ICD-10-CM | POA: Diagnosis not present

## 2020-12-19 DIAGNOSIS — R109 Unspecified abdominal pain: Secondary | ICD-10-CM | POA: Insufficient documentation

## 2020-12-19 DIAGNOSIS — Z91040 Latex allergy status: Secondary | ICD-10-CM | POA: Insufficient documentation

## 2020-12-19 DIAGNOSIS — F17210 Nicotine dependence, cigarettes, uncomplicated: Secondary | ICD-10-CM | POA: Diagnosis not present

## 2020-12-19 LAB — URINALYSIS, ROUTINE W REFLEX MICROSCOPIC
Bilirubin Urine: NEGATIVE
Glucose, UA: NEGATIVE mg/dL
Ketones, ur: NEGATIVE mg/dL
Leukocytes,Ua: NEGATIVE
Nitrite: NEGATIVE
Protein, ur: NEGATIVE mg/dL
Specific Gravity, Urine: 1.017 (ref 1.005–1.030)
pH: 6 (ref 5.0–8.0)

## 2020-12-19 NOTE — ED Triage Notes (Signed)
Pt reports blood in urine and present when she wipes. Pt says she isn't sure if it is coming from vagina or urethra, denies bleeding with intercourse. Pt also reports also reports dysuria and back pain.

## 2020-12-19 NOTE — ED Provider Notes (Signed)
[facility] EMERGENCY DEPARTMENT Provider Note   CSN: [id number] Arrival date & time: 12/19/20  1953     History Chief Complaint  Patient presents with  . Hematuria    Debra Mahoney is a 25 y.o. female.  HPI Patient presents with vaginal bleeding versus blood in the urine.  States she has had for a while now.  States began after being treated for yeast infection.  States when she wipes after going to bathroom she gets some blood.  States there is some dysuria.  States it also could be coming vaginally.  States the vaginal discharge is cleared up.  Has some pain in her pelvis at times and has low back cramping at times.  Does not really go up to unilateral side but sometimes she states either side will hurt.  She is not on blood thinners.  States she has not had a normal menses over the last 4 months.  She was regular before that.  No easy bruising.    Past Medical History:  Diagnosis Date  . Bipolar 1 disorder (HCC)   . Schizophrenia [facility])     Patient Active Problem List   Diagnosis Date Noted  . Bipolar 1 disorder (HCC) 03/15/2018  . Adjustment disorder with mixed anxiety and depressed mood 03/15/2018    Past Surgical History:  Procedure Laterality Date  . APPENDECTOMY       OB History    Gravida  1   Para      Term      Preterm      AB      Living        SAB      IAB      Ectopic      Multiple      Live Births              Family History  Problem Relation Age of Onset  . Bipolar disorder Mother   . Bipolar disorder Father     Social History   Tobacco Use  . Smoking status: Current Every Day Smoker    Packs/day: 1.00    Years: 3.00    Pack years: 3.00    Types: Cigarettes  . Smokeless tobacco: Never Used  Vaping Use  . Vaping Use: Never used  Substance Use Topics  . Alcohol use: Not Currently  . Drug use: Yes    Types: Marijuana    Home Medications Prior to Admission medications   Medication Sig Start Date End Date Taking?  Authorizing Provider  Prenatal Vit-Fe Fumarate-FA (PRENATAL VITAMINS PO) Take 1 tablet by mouth daily.   Yes [provider]  amoxicillin (AMOXIL) 875 MG tablet Take 1 tablet (875 mg total) by mouth 2 (two) times daily. Patient not taking: Reported on 12/19/2020 02/05/20   Joni Reining, PA-C  benzonatate (TESSALON PERLES) 100 MG capsule Take 2 capsules (200 mg total) by mouth 3 (three) times daily as needed. Patient not taking: Reported on 12/19/2020 02/05/20 02/04/21  Joni Reining, PA-C  benztropine (COGENTIN) 1 MG tablet Take 1 mg by mouth 2 (two) times daily. Patient not taking: Reported on 12/19/2020    [provider]  fexofenadine-pseudoephedrine (ALLEGRA-D) 60-120 MG 12 hr tablet Take 1 tablet by mouth 2 (two) times daily. Patient not taking: Reported on 12/19/2020 02/05/20   Joni Reining, PA-C  haloperidol (HALDOL) 2 MG tablet Take 2 mg by mouth 2 (two) times daily. Patient not taking: Reported on 12/19/2020  [provider]  haloperidol (HALDOL) 5 MG tablet Take 5 mg by mouth every evening. Patient not taking: Reported on 12/19/2020    [provider]  Multiple Vitamins-Minerals (MULTIVITAMIN) tablet Take 1 tablet by mouth daily. Patient not taking: Reported on 12/19/2020 12/26/19   Federico Flake, MD  sertraline (ZOLOFT) 50 MG tablet Take 150 mg by mouth daily. Patient not taking: Reported on 12/19/2020    [provider]    Allergies    Latex, Shellfish allergy, and Risperdal [risperidone]  Review of Systems   Review of Systems  Constitutional: Negative for appetite change.  HENT: Negative for congestion.   Respiratory: Negative for shortness of breath.   Gastrointestinal: Positive for abdominal pain.  Genitourinary: Positive for dysuria, hematuria and vaginal bleeding.  Musculoskeletal: Positive for back pain.  Skin: Negative for rash.  Neurological: Negative for weakness.  Hematological: Does not bruise/bleed easily.   Psychiatric/Behavioral: Negative for confusion.    Physical Exam Updated Vital Signs BP 135/90 (BP Location: Right Arm)   Pulse 100   Temp 98.6 F (37 C) (Oral)   Resp 18   Ht 5\' 6"  (1.676 m)   Wt 104.3 kg   LMP 10/01/2020 Comment: last pregnancy test here negative couple days ago  SpO2 100%   Breastfeeding Unknown   BMI 37.12 kg/m   Physical Exam Vitals and nursing note reviewed.  HENT:     Head: Normocephalic.     Mouth/Throat:     Mouth: Mucous membranes are moist.  Eyes:     Pupils: Pupils are equal, round, and reactive to light.  Cardiovascular:     Rate and Rhythm: Regular rhythm.  Abdominal:     Comments: Mild suprapubic tenderness.  Genitourinary:    Comments: No CVA tenderness.  There is blood on pelvic exam without other discharge. Musculoskeletal:        General: No tenderness.     Cervical back: Neck supple.  Skin:    General: Skin is warm.     Capillary Refill: Capillary refill takes less than 2 seconds.  Neurological:     Mental Status: She is alert and oriented to person, place, and time.     ED Results / Procedures / Treatments   Labs (all labs ordered are listed, but only abnormal results are displayed) Labs Reviewed  URINALYSIS, ROUTINE W REFLEX MICROSCOPIC - Abnormal; Notable for the following components:      Result Value   Hgb urine dipstick LARGE (*)    Bacteria, UA RARE (*)    All other components within normal limits    EKG None  Radiology No results found.  Procedures Procedures   Medications Ordered in ED Medications - No data to display  ED Course  I have reviewed the triage vital signs and the nursing notes.  Pertinent labs & imaging results that were available during my care of the patient were reviewed by me and considered in my medical decision making (see chart for details).    MDM Rules/Calculators/A&P                         Patient with vaginal bleeding.  Os closed.  No real pain.  Not pregnant as a  pregnancy test a week ago.  Reviewed blood work that time which was also reassuring.  Urinalysis done and showed blood but I think this is likely secondary from the vaginal bleeding.  Pelvic exam showed some frank blood.  I think  most likely the uterine bleeding is the source of the blood.  I think she is stable to follow-up as an outpatient with OB/GYN.  Does not need acute ultrasound at this time.  Doubt patient also having urine with blood.  Most likely this is incidental.  Follow-up as needed as an outpatient Final Clinical Impression(s) / ED Diagnoses Final diagnoses:  Vaginal bleeding    Rx / DC Orders ED Discharge Orders    None       Benjiman Core, MD 12/19/20 2241
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
## 2022-04-17 DIAGNOSIS — Z5329 Procedure and treatment not carried out because of patient's decision for other reasons: Secondary | ICD-10-CM

## 2022-04-17 NOTE — ED Triage Notes (Signed)
Pt c/o right flank pain, onset prior to arrival.  Pt states when she urinates it doesn't feel like she is completely emptying her bladder.

## 2022-04-17 NOTE — ED Notes (Signed)
Upon wheeling patient back to waiting room, patient states she wants to transfer to another hospital because she's "not fucking waiting here all night".  Pt's small child with patient.     Judi Saa, RN  04/17/22 2358

## 2022-04-18 ENCOUNTER — Emergency Department: Admit: 2022-04-18 | Payer: MEDICAID

## 2022-04-18 ENCOUNTER — Inpatient Hospital Stay: Admit: 2022-04-18 | Discharge: 2022-04-18 | Disposition: A | Discharge status: Home / Self Care | Payer: MEDICAID

## 2022-04-18 ENCOUNTER — Inpatient Hospital Stay
Admit: 2022-04-18 | Discharge: 2022-04-19 | Disposition: A | Discharge status: Home / Self Care | Payer: MEDICAID | Attending: Emergency Medicine

## 2022-04-18 DIAGNOSIS — R109 Unspecified abdominal pain: Secondary | ICD-10-CM

## 2022-04-18 DIAGNOSIS — N912 Amenorrhea, unspecified: Secondary | ICD-10-CM

## 2022-04-18 LAB — COMPREHENSIVE METABOLIC PANEL
ALT: 18 U/L (ref 12–65)
AST: 14 U/L — ABNORMAL LOW (ref 15–37)
Albumin/Globulin Ratio: 0.8 (ref 0.4–1.6)
Albumin: 3.7 g/dL (ref 3.5–5.0)
Alk Phosphatase: 63 U/L (ref 50–136)
Anion Gap: 5 mmol/L (ref 2–11)
BUN: 13 MG/DL (ref 6–23)
CO2: 29 mmol/L (ref 21–32)
Calcium: 9.3 MG/DL (ref 8.3–10.4)
Chloride: 104 mmol/L (ref 101–110)
Creatinine: 0.8 MG/DL (ref 0.6–1.0)
Est, Glom Filt Rate: >60 mL/min/{1.73_m2} (ref >60)
Globulin: 4.4 g/dL (ref 2.8–4.5)
Glucose: 90 mg/dL (ref 65–100)
Potassium: 4 mmol/L (ref 3.5–5.1)
Sodium: 138 mmol/L (ref 133–143)
Total Bilirubin: 0.3 MG/DL (ref 0.2–1.1)
Total Protein: 8.1 g/dL (ref 6.3–8.2)

## 2022-04-18 LAB — POC PREGNANCY UR-QUAL: Preg Test, Ur: NEGATIVE

## 2022-04-18 LAB — CBC WITH AUTO DIFFERENTIAL
Absolute Immature Granulocyte: 0 10*3/uL (ref 0.0–0.5)
Basophils %: 0 % (ref 0.0–2.0)
Basophils Absolute: 0 10*3/uL (ref 0.0–0.2)
Eosinophils %: 1 % (ref 0.5–7.8)
Eosinophils Absolute: 0.1 10*3/uL (ref 0.0–0.8)
Hematocrit: 37.8 % (ref 35.8–46.3)
Hemoglobin: 12.2 g/dL (ref 11.7–15.4)
Immature Granulocytes: 0 % (ref 0.0–5.0)
Lymphocytes %: 34 % (ref 13–44)
Lymphocytes Absolute: 2 10*3/uL (ref 0.5–4.6)
MCH: 24.3 PG — ABNORMAL LOW (ref 26.1–32.9)
MCHC: 32.3 g/dL (ref 31.4–35.0)
MCV: 75.1 FL — ABNORMAL LOW (ref 82–102)
MPV: 10.7 FL (ref 9.4–12.3)
Monocytes %: 10 % (ref 4.0–12.0)
Monocytes Absolute: 0.5 10*3/uL (ref 0.1–1.3)
Neutrophils %: 55 % (ref 43–78)
Neutrophils Absolute: 3.1 10*3/uL (ref 1.7–8.2)
Platelets: 321 10*3/uL (ref 150–450)
RBC: 5.03 M/uL (ref 4.05–5.2)
RDW: 16.3 % — ABNORMAL HIGH (ref 11.9–14.6)
WBC: 5.7 10*3/uL (ref 4.3–11.1)
nRBC: 0 10*3/uL (ref 0.0–0.2)

## 2022-04-18 LAB — POCT URINALYSIS DIPSTICK
Bilirubin, Urine, POC: NEGATIVE
Blood, UA POC: NEGATIVE
Glucose, UA POC: NEGATIVE mg/dL
Ketones, Urine, POC: NEGATIVE mg/dL
Leukocyte Est, UA POC: NEGATIVE
Nitrite, Urine, POC: NEGATIVE
Specific Gravity, Urine, POC: 1.025 — ABNORMAL HIGH (ref 1.001–1.023)
URINE UROBILINOGEN POC: 0.2 EU/dL (ref 0.2–1.0)
pH, Urine, POC: 7 (ref 5.0–9.0)

## 2022-04-18 LAB — LIPASE: Lipase: 72 U/L — ABNORMAL LOW (ref 73–393)

## 2022-04-18 MED ORDER — HYOSCYAMINE SULFATE SL 0.125 MG SL SUBL
0.125 | Freq: Four times a day (QID) | SUBLINGUAL | 0 refills | Status: AC | PRN
Start: 2022-04-18 — End: ?

## 2022-04-18 MED ORDER — MELOXICAM 15 MG PO TABS
15 MG | ORAL_TABLET | Freq: Every day | ORAL | 1 refills | Status: AC
Start: 2022-04-18 — End: 2022-05-02

## 2022-04-18 NOTE — Discharge Instructions (Addendum)
Call OB/GYN doctor for appointment to recheck.  Also can use primary care doctor.  Medications as directed.  Recheck for high fever vomiting or other concerns.    We would love to help you get a primary care doctor for follow-up after your emergency department visit.    Please call [phone] between 7AM - 6PM Monday to Friday.  A care navigator will be able to assist you with setting up a doctor close to your home.

## 2022-04-18 NOTE — ED Triage Notes (Signed)
[place] [facility]  Pt report right flank pain for 4 months.  Pt reports pain has increased and is lightheaded.  Pt report history of bloody urine and possible blood from vagina that last for 3 weeks.      BP 138/78, HR 90, 02 100 RA, T 98.2

## 2022-04-18 NOTE — ED Notes (Signed)
I have reviewed discharge instructions with the patient.  The patient verbalized understanding.    Patient left ED via Discharge Method: ambulatory to Home with self.    Opportunity for questions and clarification provided.       Patient given 2 scripts.         To continue your aftercare when you leave the hospital, you may receive an automated call from our care team to check in on how you are doing.  This is a free service and part of our promise to provide the best care and service to meet your aftercare needs." If you have questions, or wish to unsubscribe from this service please call [phone].  Thank you for Choosing our [facility] Emergency Department.       Kirby Crigler, RN  04/18/22 2010

## 2022-04-18 NOTE — ED Notes (Signed)
Report given to Jacob RN who will assume care at this time.     Yenesis Even, RN  04/18/22 1904

## 2022-04-18 NOTE — ED Provider Notes (Signed)
Emergency Department Provider Note       PCP: None None   Age: 26 y.o.   Sex: female     DISPOSITION Decision To Discharge 04/18/2022 07:58:34 PM       ICD-10-CM    1. Acute right flank pain  R10.9       2. Amenorrhea  N91.2           Medical Decision Making   Somewhat chronic sounding right-sided abdominal pain with irregular menses and history of anemia.  Check for UTI.  Screen for anemia.  Patient already with appendectomy.  Potential for gallbladder issues.  Less likely would be kidney stone.  Possible uterine fibroids or cysts on the ovaries.  Ultrasound.  Do not believe CT scan indicated.  Screening lab work.  Rule out pregnancy  Complexity of Problems Addressed:  1 or more acute illnesses that pose a threat to life or bodily function.     Data Reviewed and Analyzed:   I independently ordered and reviewed each unique test.  I reviewed external records: ED visit note from an outside group.  Emergency department visit at another hospital 1 month ago.  Patient treated with Provera for dysfunctional uterine bleeding.  Patient missed OB/GYN appointment.  The patients assessment required an independent historian: EMS.  The reason they were needed is important historical information not provided by the patient.    I independently ordered and interpreted the ED EKG in the absence of a Cardiologist.    Rate: 69  EKG Interpretation: EKG Interpretation: sinus rhythm, no evidence of arrhythmia  ST Segments: Normal ST segments - NO STEMI      Discussion of management or test interpretation.  Work-up completely negative.  May have issues with spastic colon or dysmenorrhea since irregular periods.  Will place on meloxicam and Levsin.  Needs referral to GYN Dr.      Risk of Complications and/or Morbidity of Patient Management:  Prescription drug management performed.  Diagnosis or care significantly limited by social determinants of health housing and finance issues.         Is this patient to be included in the SEP-1 core  measure due to severe sepsis or septic shock? No Exclusion criteria - the patient is NOT to be included for SEP-1 Core Measure due to: Infection is not suspected      History      Molly Long is a 26 y.o. female who presents to the Emergency Department with chief complaint of    Chief Complaint   Patient presents with    Flank Pain      26 year old female evidently has a history of anemia.  She has had appendectomy as well.  Patient describes more than 46-monthhistory of some intermittent right flank pain.  Occasionally radiating to the right lower quadrant.  Pain not every day but comes and goes.  No particular aggravating or relieving factors.  Today pain was worse and patient felt lightheaded like she might pass out.  No chest pain or palpitations.  No shortness of breath.  No dysuria.  She has had some occasional diarrhea for about 7 months.  No abnormal bleeding.  Some chills and sweats in the last week or so.  No vaginal bleeding at this point.  States she had very heavy bleeding a few weeks ago and was seen in another hospital and prescribed Provera.  Denies any history of ovarian cysts or fibroids.  No history of kidney stones but does have a  history UTI.    The history is provided by the patient and the EMS personnel.      Review of Systems   Constitutional:  Positive for chills and fatigue. Negative for fever.   Respiratory:  Negative for cough and shortness of breath.    Cardiovascular:  Negative for chest pain and palpitations.   Gastrointestinal:  Positive for abdominal pain and diarrhea. Negative for blood in stool, nausea and vomiting.   Genitourinary:  Negative for difficulty urinating and dysuria.   Musculoskeletal:  Positive for back pain. Negative for neck pain.   Skin:  Negative for color change and rash.     Physical Exam     Vitals signs and nursing note reviewed:  Vitals:    04/18/22 1706 04/18/22 1736 04/18/22 1814 04/18/22 1834   BP:   116/66 (!) 103/59   Pulse:  65  70   Resp:  14  16    Temp:       TempSrc:       SpO2: 98%  100% 100%   Weight:       Height:          Physical Exam  Vitals and nursing note reviewed.   Constitutional:       Appearance: She is not ill-appearing.   HENT:      Head: Normocephalic and atraumatic.      Right Ear: External ear normal.      Left Ear: External ear normal.      Mouth/Throat:      Mouth: Mucous membranes are moist.      Pharynx: Oropharynx is clear.   Eyes:      General: No scleral icterus.     Extraocular Movements: Extraocular movements intact.      Pupils: Pupils are equal, round, and reactive to light.   Cardiovascular:      Rate and Rhythm: Normal rate and regular rhythm.   Pulmonary:      Effort: Pulmonary effort is normal.      Breath sounds: Normal breath sounds.   Abdominal:      Palpations: Abdomen is soft.      Tenderness: There is abdominal tenderness in the right upper quadrant and right lower quadrant. There is no right CVA tenderness.       Musculoskeletal:         General: No swelling or tenderness.      Cervical back: Normal range of motion and neck supple.      Right lower leg: No edema.      Left lower leg: No edema.   Skin:     General: Skin is warm and dry.   Neurological:      General: No focal deficit present.      Mental Status: She is alert.        Procedures     Procedures    Orders Placed This Encounter   Procedures    US PELVIS LIMITED    US ABDOMEN LIMITED Specify organ? LIVER (Please check for any right-sided hydro in the ureter.  Thanks), GALLBLADDER    CBC with Auto Differential    Comprehensive Metabolic Panel    Lipase    POC PREGNANCY UR-QUAL    POCT Urine Dipstick    POCT Urinalysis no Micro    POC Pregnancy Urine Qual    EKG 12 Lead    Insert peripheral IV        Medications given during this emergency department visit:  Medications - No data to display    New Prescriptions:  New Prescriptions    HYOSCYAMINE SULFATE SL (LEVSIN/SL) 0.125 MG SUBL    Place 0.125-0.25 mg under the tongue every 6 hours as needed (Pain)     MELOXICAM (MOBIC) 15 MG TABLET    Take 1 tablet by mouth daily for 14 days        No past medical history on file.     No past surgical history on file.     Social History     Socioeconomic History    Marital status: Single        Previous Medications    No medications on file        Results for orders placed or performed during the hospital encounter of 04/18/22   US PELVIS LIMITED    Narrative    EXAMINATION:  PELVIC ULTRASOUND COMPLETE  and Duplex Doppler Complete    DATE OF EXAM: 04/18/2022 4:45 PM      HISTORY: R pelvis pain/amenorrhea. ? cysts    COMPARISON:  None.     TECHNIQUE: Transabdominal images were obtained as well as color Doppler and   duplex Doppler imaging with spectral analysis of arterial and venous waveform   patterns.    FINDINGS:    Uterus: The uterus measures 8.3 x 5.3 x 6.5 cm. There is no uterine mass or   focal parenchymal abnormality.  Uterus is normal in size.     Endometrium: The endometrial stripe measures 0.6 cm; this is normal.    Right Ovary: The right ovary measures 2.9 x 2.2 x 2.7 cm.  There is no   concerning mass.  Duplex Doppler interrogation demonstrates normal arterial   inflow and venous outflow spectral waveform patterns.    Left Ovary: The left ovary is not visualized transabdominally and may be   obscured by bowel gas versus not visualized secondary to position in pelvis.    Free Fluid: No significant free fluid.         Impression    1.  Examination limited as patient declined the transvaginal portion of the   assessment.  2.  No acute sonographic abnormality of the pelvis within the limitations of the   examination.  3.  The left ovary is not visualized.  4.  Blood flow documented to the right ovary.      Thank you for the referral of this patient. This exam was interpreted by an   Tax adviser of Radiology certified radiologist with subspecialty fellowship   in [place].   If there are any questions regarding this exam please call us   at [phone].      Kathie Dike, M.D.   04/18/2022 7:39:00 PM   US ABDOMEN LIMITED Specify organ? LIVER, GALLBLADDER    Narrative    EXAMINATION: RIGHT UPPER QUADRANT ULTRASOUND      DATE OF EXAM: 04/18/2022 4:45 PM    HISTORY: Right-sided abdominal pain     COMPARISON EXAM: None available.    FINDINGS:    Liver:Normal size of the liver.   Normal echogenicity of the hepatic parenchyma.    No sonographic evidence of focal liver lesion.  No intrahepatic biliary ductal   dilatation.    Gallbladder:  No gallstones, gallbladder wall thickening or pericholecystic   fluid.    Common bile duct: Measures 5 mm; this is within normal limits.    Pancreas: Unremarkable where visualized.    Right kidney: The right kidney measures 11.4  cm. The echotexture of the kidney   is normal.  There is no evidence of hydronephrosis.     Ascites: None.    Aorta/IVC: Normal where visualized.        Impression    1.  Unremarkable right upper quadrant ultrasound.       Thank you for the referral of this patient. This exam was interpreted by an   Tax adviser of Radiology certified radiologist with subspecialty fellowship   in [place].   If there are any questions regarding this exam please call us   at [phone].     Kathie Dike, M.D.   04/18/2022 7:36:00 PM   CBC with Auto Differential   Result Value Ref Range    WBC 5.7 4.3 - 11.1 K/uL    RBC 5.03 4.05 - 5.2 M/uL    Hemoglobin 12.2 11.7 - 15.4 g/dL    Hematocrit 37.8 35.8 - 46.3 %    MCV 75.1 (L) 82 - 102 FL    MCH 24.3 (L) 26.1 - 32.9 PG    MCHC 32.3 31.4 - 35.0 g/dL    RDW 16.3 (H) 11.9 - 14.6 %    Platelets 321 150 - 450 K/uL    MPV 10.7 9.4 - 12.3 FL    nRBC 0.00 0.0 - 0.2 K/uL    Differential Type AUTOMATED      Neutrophils % 55 43 - 78 %    Lymphocytes % 34 13 - 44 %    Monocytes % 10 4.0 - 12.0 %    Eosinophils % 1 0.5 - 7.8 %    Basophils % 0 0.0 - 2.0 %    Immature Granulocytes 0 0.0 - 5.0 %    Neutrophils Absolute 3.1 1.7 - 8.2 K/UL    Lymphocytes Absolute 2.0 0.5 - 4.6 K/UL     Monocytes Absolute 0.5 0.1 - 1.3 K/UL    Eosinophils Absolute 0.1 0.0 - 0.8 K/UL    Basophils Absolute 0.0 0.0 - 0.2 K/UL    Absolute Immature Granulocyte 0.0 0.0 - 0.5 K/UL   Comprehensive Metabolic Panel   Result Value Ref Range    Sodium 138 133 - 143 mmol/L    Potassium 4.0 3.5 - 5.1 mmol/L    Chloride 104 101 - 110 mmol/L    CO2 29 21 - 32 mmol/L    Anion Gap 5 2 - 11 mmol/L    Glucose 90 65 - 100 mg/dL    BUN 13 6 - 23 MG/DL    Creatinine 0.80 0.6 - 1.0 MG/DL    Est, Glom Filt Rate >60 >60 ml/min/1.59m    Calcium 9.3 8.3 - 10.4 MG/DL    Total Bilirubin 0.3 0.2 - 1.1 MG/DL    ALT 18 12 - 65 U/L    AST 14 (L) 15 - 37 U/L    Alk Phosphatase 63 50 - 136 U/L    Total Protein 8.1 6.3 - 8.2 g/dL    Albumin 3.7 3.5 - 5.0 g/dL    Globulin 4.4 2.8 - 4.5 g/dL    Albumin/Globulin Ratio 0.8 0.4 - 1.6     Lipase   Result Value Ref Range    Lipase 72 (L) 73 - 393 U/L   POCT Urinalysis no Micro   Result Value Ref Range    Specific Gravity, Urine, POC 1.025 (H) 1.001 - 1.023      pH, Urine, POC 7.0 5.0 - 9.0      Protein, Urine,  POC TRACE (A) NEG mg/dL    Glucose, UA POC Negative NEG mg/dL    Ketones, Urine, POC Negative NEG mg/dL    Bilirubin, Urine, POC Negative NEG      Blood, UA POC Negative NEG      URINE UROBILINOGEN POC 0.2 0.2 - 1.0 EU/dL    Nitrite, Urine, POC Negative NEG      Leukocyte Est, UA POC Negative NEG      Performed by: Delight Ovens    POC Pregnancy Urine Qual   Result Value Ref Range    Preg Test, Ur Negative NEG     EKG 12 Lead   Result Value Ref Range    Ventricular Rate 69 BPM    Atrial Rate 69 BPM    P-R Interval 160 ms    QRS Duration 93 ms    Q-T Interval 392 ms    QTc Calculation (Bazett) 420 ms    P Axis 57 degrees    R Axis 84 degrees    T Axis 37 degrees    Diagnosis Sinus rhythm  Probable left atrial enlargement           US ABDOMEN LIMITED Specify organ? LIVER, GALLBLADDER   Final Result      1.  Unremarkable right upper quadrant ultrasound.          Thank you for the referral of this  patient. This exam was interpreted by an    Tax adviser of Radiology certified radiologist with subspecialty fellowship    in [place].   If there are any questions regarding this exam please call us    at [phone].       Kathie Dike, M.D.    04/18/2022 7:36:00 PM      US PELVIS LIMITED   Final Result      1.  Examination limited as patient declined the transvaginal portion of the    assessment.   2.  No acute sonographic abnormality of the pelvis within the limitations of the    examination.   3.  The left ovary is not visualized.   4.  Blood flow documented to the right ovary.         Thank you for the referral of this patient. This exam was interpreted by an    Tax adviser of Radiology certified radiologist with subspecialty fellowship    in [place].   If there are any questions regarding this exam please call us    at [phone].       Kathie Dike, M.D.    04/18/2022 7:39:00 PM                        Voice dictation software was used during the making of this note.  This software is not perfect and grammatical and other typographical errors may be present.  This note has not been completely proofread for errors.     Dorothyann Peng, MD  04/18/22 2000

## 2022-04-18 NOTE — ED Notes (Signed)
Pt to registration desk, states she would rather "just go to sleep" than wait all night.  Pt ambulated out of ER in no distress.     Judi Saa, RN  04/18/22 0001

## 2022-04-19 LAB — EKG 12-LEAD
Atrial Rate: 69 {beats}/min
P Axis: 57 degrees
P-R Interval: 160 ms
Q-T Interval: 392 ms
QRS Duration: 93 ms
QTc Calculation (Bazett): 420 ms
R Axis: 84 degrees
T Axis: 37 degrees
Ventricular Rate: 69 {beats}/min

## 2022-05-03 DIAGNOSIS — N926 Irregular menstruation, unspecified: Secondary | ICD-10-CM

## 2022-05-03 NOTE — ED Notes (Signed)
Attempted to call pt from the waiting area. Pt did not respond and was seen leaving through the Er entrance. Assumed pt to have eloped.      Roderic Scarce, RN  05/04/22 [phone]

## 2022-05-03 NOTE — ED Triage Notes (Signed)
Pt arrives to ER via EMS with reports of nausea, discomfort to LLQ. Pt reports she has concern for pregnancy and would like to be tested.     EMS vitals were 118/76, BGL 113, 98.5 temp, 96HR.

## 2022-05-04 ENCOUNTER — Inpatient Hospital Stay: Admit: 2022-05-04 | Discharge: 2022-05-04 | Payer: MEDICAID

## 2022-05-04 LAB — URINALYSIS, MICRO
Casts: 0 /lpf
Crystals: 0 /LPF
Mucus, UA: 0 /lpf

## 2022-05-04 LAB — POC PREGNANCY UR-QUAL: Preg Test, Ur: NEGATIVE

## 2022-06-15 ENCOUNTER — Inpatient Hospital Stay
Admit: 2022-06-15 | Discharge: 2022-06-15 | Disposition: A | Discharge status: Home / Self Care | Payer: MEDICAID | Attending: Emergency Medicine

## 2022-06-15 DIAGNOSIS — N938 Other specified abnormal uterine and vaginal bleeding: Secondary | ICD-10-CM

## 2022-06-15 LAB — CBC
Hematocrit: 37.1 % (ref 35.8–46.3)
Hemoglobin: 11.6 g/dL — ABNORMAL LOW (ref 11.7–15.4)
MCH: 23.9 PG — ABNORMAL LOW (ref 26.1–32.9)
MCHC: 31.3 g/dL — ABNORMAL LOW (ref 31.4–35.0)
MCV: 76.3 FL — ABNORMAL LOW (ref 82–102)
MPV: 12.3 FL (ref 9.4–12.3)
Platelets: 347 10*3/uL (ref 150–450)
RBC: 4.86 M/uL (ref 4.05–5.2)
RDW: 14.9 % — ABNORMAL HIGH (ref 11.9–14.6)
WBC: 6.5 10*3/uL (ref 4.3–11.1)
nRBC: 0 10*3/uL (ref 0.0–0.2)

## 2022-06-15 LAB — BASIC METABOLIC PANEL
Anion Gap: 1 mmol/L — ABNORMAL LOW (ref 2–11)
BUN: 14 MG/DL (ref 6–23)
CO2: 30 mmol/L (ref 21–32)
Calcium: 9.1 MG/DL (ref 8.3–10.4)
Chloride: 110 mmol/L (ref 101–110)
Creatinine: 0.8 MG/DL (ref 0.6–1.0)
Est, Glom Filt Rate: >60 mL/min/{1.73_m2} (ref >60)
Glucose: 107 mg/dL — ABNORMAL HIGH (ref 65–100)
Potassium: 3.8 mmol/L (ref 3.5–5.1)
Sodium: 141 mmol/L (ref 133–143)

## 2022-06-15 LAB — WET PREP, GENITAL
Wet Prep: 5
Wet Prep: NONE SEEN
Wet Prep: NONE SEEN

## 2022-06-15 LAB — POCT URINALYSIS DIPSTICK
Bilirubin, Urine, POC: NEGATIVE
Glucose, UA POC: NEGATIVE mg/dL
Ketones, Urine, POC: NEGATIVE mg/dL
Leukocyte Est, UA POC: NEGATIVE
Nitrite, Urine, POC: NEGATIVE
Protein, Urine, POC: NEGATIVE mg/dL
Specific Gravity, Urine, POC: 1.025 — ABNORMAL HIGH (ref 1.001–1.023)
URINE UROBILINOGEN POC: 0.2 EU/dL (ref 0.2–1.0)
pH, Urine, POC: 6 (ref 5.0–9.0)

## 2022-06-15 LAB — POC PREGNANCY UR-QUAL: Preg Test, Ur: NEGATIVE

## 2022-06-15 MED ORDER — IBUPROFEN 800 MG PO TABS
800 MG | ORAL | Status: AC
Start: 2022-06-15 — End: 2022-06-15
  Administered 2022-06-15: 14:00:00 800 mg via ORAL

## 2022-06-15 MED ORDER — MEDROXYPROGESTERONE ACETATE 10 MG PO TABS
10 MG | ORAL_TABLET | Freq: Every day | ORAL | 0 refills | Status: AC
Start: 2022-06-15 — End: ?

## 2022-06-15 MED FILL — IBUPROFEN 800 MG PO TABS: 800 MG | ORAL | Qty: 1

## 2022-06-15 NOTE — ED Notes (Signed)
I have reviewed discharge instructions with the patient.  The patient verbalized understanding.    Patient left ED via Discharge Method: ambulatory to Home with self.    Opportunity for questions and clarification provided.       Patient given 1 scripts.         To continue your aftercare when you leave the hospital, you may receive an automated call from our care team to check in on how you are doing.  This is a free service and part of our promise to provide the best care and service to meet your aftercare needs." If you have questions, or wish to unsubscribe from this service please call [phone].  Thank you for Choosing our [facility] Emergency Department.        Samuel Germany, LPN  81/85/63 [id number]

## 2022-06-15 NOTE — ED Provider Notes (Signed)
Emergency Department Provider Note       PCP: None, None   Age: 26 y.o.   Sex: female     DISPOSITION Decision To Discharge 06/15/2022 09:24:59 AM       ICD-10-CM    1. DUB (dysfunctional uterine bleeding)  N93.8 [facility] - Reynold Bowen, MD, OB/GYN, [facility] Decision Making     Complexity of Problems Addressed:  1 or more acute illnesses that pose a threat to life or bodily function.     Data Reviewed and Analyzed:  I independently ordered and reviewed each unique test.  I reviewed external records: ED visit note from an outside group.   ED visit in July at [facility] with referral to Dr. Harolyn Rutherford for vaginal bleeding.  Placed on Medrox E Provera for 10 days.    I interpreted the laboratory evaluation, UA and UPT.    Discussion of management or test interpretation.  Patient with history of anemia and dysfunctional uterine bleeding presented with heavy vaginal bleeding onset yesterday.  Couple of months ago patient bled for 4 months straight.  She was referred to Dr. Harolyn Rutherford and started on Medrox he Provera for 10 days at that time which helped, but she has since moved to [place] and did not follow-up with Dr. Ave Filter and would prefer to see a [facility].  I called the [facility] and Dr. Reynold Bowen is up next on the on-call list.  Referral is placed.  Patient restarted on Medrox E Provera for 10 days.  Vaginal bleeding return precautions provided.  Hemoglobin stable at 11 today.      Risk of Complications and/or Morbidity of Patient Management:  Prescription drug management performed.         History       Vaginal bleeding began yesterday and this morning she passed a large clot.  Patient has used 2 maxi pads this morning.  Patient reports possibility of being pregnant.  Last normal menstrual period September 5 that lasted 8 days prior to that.  Before lasted for 4 months.  Patient new to this area and does not have PCP or OB/GYN in the area.    The history is provided by the  patient.        Review of Systems   Constitutional:  Negative for fever.   Gastrointestinal:  Positive for abdominal pain. Negative for nausea and vomiting.   Genitourinary:  Positive for vaginal bleeding. Negative for dysuria.   Musculoskeletal:  Positive for back pain.       Physical Exam     Vitals signs and nursing note reviewed:  Vitals:    06/15/22 0712   BP: 131/76   Pulse: 88   Resp: 19   Temp: 98 F (36.7 C)   TempSrc: Oral   SpO2: 100%   Weight: 220 lb (99.8 kg)   Height: 5\' 5"  (1.651 m)      Physical Exam  Vitals and nursing note reviewed.   Constitutional:       Appearance: Normal appearance.   HENT:      Head: Normocephalic and atraumatic.      Nose: Nose normal.      Mouth/Throat:      Mouth: Mucous membranes are moist.   Eyes:      Pupils: Pupils are equal, round, and reactive to light.      Comments: Conjunctive a pale   Cardiovascular:  Rate and Rhythm: Normal rate and regular rhythm.      Pulses: Normal pulses.      Heart sounds: Normal heart sounds.   Pulmonary:      Effort: Pulmonary effort is normal.      Breath sounds: Normal breath sounds.   Abdominal:      General: There is no distension.      Palpations: Abdomen is soft.      Tenderness: There is abdominal tenderness (Diffuse mild tenderness). There is no guarding or rebound.   Musculoskeletal:         General: Normal range of motion.   Skin:     General: Skin is warm and dry.   Neurological:      Mental Status: She is alert and oriented to person, place, and time.   Psychiatric:         Behavior: Behavior normal.          Procedures     Procedures    Orders Placed This Encounter   Procedures    C.trachomatis N.gonorrhoeae DNA    Wet prep, genital    CBC    Basic Metabolic Panel    [facility] - Reynold Bowen, MD, OB/GYN, [place]    POC PREGNANCY UR-QUAL    POCT Urine Dipstick    POC Pregnancy Urine Qual    POCT Urinalysis no Micro    POC Pregnancy Urine Qual    Insert peripheral IV        Medications given during this emergency department  visit:  Medications   ibuprofen (ADVIL;MOTRIN) tablet 800 mg (has no administration in time range)       New Prescriptions    MEDROXYPROGESTERONE (PROVERA) 10 MG TABLET    Take 1 tablet by mouth daily        No past medical history on file.     No past surgical history on file.     Social History     Socioeconomic History    Marital status: Single        Previous Medications    HYOSCYAMINE SULFATE SL (LEVSIN/SL) 0.125 MG SUBL    Place 0.125-0.25 mg under the tongue every 6 hours as needed (Pain)    MELOXICAM (MOBIC) 15 MG TABLET    Take 1 tablet by mouth daily for 14 days        Results for orders placed or performed during the hospital encounter of 06/15/22   Wet prep, genital    Specimen: Vaginal   Result Value Ref Range    Special Requests NO SPECIAL REQUESTS      Wet Prep 5 T0 15 WBC'S SEEN PER HPF     Wet Prep FEW  CLUE CELLS PRESENT        Wet Prep NO TRICHOMONAS SEEN      Wet Prep NO YEAST SEEN     CBC   Result Value Ref Range    WBC 6.5 4.3 - 11.1 K/uL    RBC 4.86 4.05 - 5.2 M/uL    Hemoglobin 11.6 (L) 11.7 - 15.4 g/dL    Hematocrit 41.9 37.9 - 46.3 %    MCV 76.3 (L) 82 - 102 FL    MCH 23.9 (L) 26.1 - 32.9 PG    MCHC 31.3 (L) 31.4 - 35.0 g/dL    RDW 02.4 (H) 09.7 - 14.6 %    Platelets 347 150 - 450 K/uL    MPV 12.3 9.4 - 12.3 FL    nRBC 0.00  0.0 - 0.2 K/uL   Basic Metabolic Panel   Result Value Ref Range    Sodium 141 133 - 143 mmol/L    Potassium 3.8 3.5 - 5.1 mmol/L    Chloride 110 101 - 110 mmol/L    CO2 30 21 - 32 mmol/L    Anion Gap 1 (L) 2 - 11 mmol/L    Glucose 107 (H) 65 - 100 mg/dL    BUN 14 6 - 23 MG/DL    Creatinine 0.80 0.6 - 1.0 MG/DL    Est, Glom Filt Rate >60 >60 ml/min/1.79m2    Calcium 9.1 8.3 - 10.4 MG/DL   POCT Urinalysis no Micro   Result Value Ref Range    Specific Gravity, Urine, POC 1.025 (H) 1.001 - 1.023      pH, Urine, POC 6.0 5.0 - 9.0      Protein, Urine, POC Negative NEG mg/dL    Glucose, UA POC Negative NEG mg/dL    Ketones, Urine, POC Negative NEG mg/dL    Bilirubin, Urine, POC  Negative NEG      Blood, UA POC MODERATE (A) NEG      URINE UROBILINOGEN POC 0.2 0.2 - 1.0 EU/dL    Nitrite, Urine, POC Negative NEG      Leukocyte Est, UA POC Negative NEG      Performed by:  [place]    POC Pregnancy Urine Qual   Result Value Ref Range    Preg Test, Ur Negative NEG          No orders to display                     Voice dictation software was used during the making of this note.  This software is not perfect and grammatical and other typographical errors may be present.  This note has not been completely proofread for errors.     Verlon Au, MD  06/15/22 [phone]

## 2022-06-15 NOTE — Discharge Instructions (Addendum)
Ibuprofen 800 mg 3 times daily for pain and bleeding.  Provera daily for 10 days as prescribed for bleeding.  Follow-up with Dr. Corene Cornea Hood-call in 2 to 3 days for appointment if you have not heard from them.  Return here if worse.Molly Long

## 2022-06-15 NOTE — ED Triage Notes (Signed)
Patient a 26 y/o Female reports to the ED with complaint of vaginal pain and vaginal bleeding. States She is currently on her menstrual cycle and was 1-2 days late. States she passed a clot the size of her fist this morning. Is currently sexually active and could have been pregnant.

## 2022-06-17 LAB — C.TRACHOMATIS N.GONORRHOEAE DNA
Chlamydia trachomatis, NAA: NEGATIVE
N. Gonorrhoeae RNA: NEGATIVE

## 2022-06-29 ENCOUNTER — Encounter: Payer: MEDICAID | Attending: Obstetrics & Gynecology

## 2022-06-29 NOTE — Progress Notes (Unsigned)
Molly Long  is a 26 y.o. female, No obstetric history on file., No LMP recorded., new patient who is seen for ER follow-up.
